# Patient Record
Sex: Female | Born: 1980 | Race: White | Hispanic: No | Marital: Single | Smoking: Current every day smoker
Health system: Southern US, Community
[De-identification: ages and names within clinical notes are randomized; demographics above are authoritative.]

## PROBLEM LIST (undated history)

## (undated) DIAGNOSIS — F419 Anxiety disorder, unspecified: Secondary | ICD-10-CM

## (undated) DIAGNOSIS — F1110 Opioid abuse, uncomplicated: Secondary | ICD-10-CM

## (undated) HISTORY — PX: DILATION AND CURETTAGE OF UTERUS: SHX78

## (undated) HISTORY — PX: WISDOM TOOTH EXTRACTION: SHX21

---

## 2002-05-25 ENCOUNTER — Other Ambulatory Visit: Admission: RE | Admit: 2002-05-25 | Discharge: 2002-05-25 | Payer: Self-pay

## 2009-05-20 ENCOUNTER — Emergency Department (HOSPITAL_COMMUNITY): Admission: EM | Admit: 2009-05-20 | Discharge: 2009-05-20 | Payer: Self-pay | Admitting: Emergency Medicine

## 2009-07-01 ENCOUNTER — Emergency Department (HOSPITAL_COMMUNITY): Admission: EM | Admit: 2009-07-01 | Discharge: 2009-07-02 | Payer: Self-pay | Admitting: Emergency Medicine

## 2009-11-18 ENCOUNTER — Emergency Department (HOSPITAL_COMMUNITY): Admission: EM | Admit: 2009-11-18 | Discharge: 2009-11-19 | Payer: Self-pay | Admitting: Emergency Medicine

## 2010-01-25 ENCOUNTER — Emergency Department (HOSPITAL_COMMUNITY)
Admission: EM | Admit: 2010-01-25 | Discharge: 2010-01-25 | Payer: Self-pay | Source: Home / Self Care | Admitting: Emergency Medicine

## 2010-12-03 ENCOUNTER — Encounter: Payer: Self-pay | Admitting: *Deleted

## 2010-12-03 ENCOUNTER — Emergency Department (HOSPITAL_COMMUNITY)
Admission: EM | Admit: 2010-12-03 | Discharge: 2010-12-04 | Disposition: A | Discharge status: Other Acute Inpatient Hospital | Payer: Medicaid Other | Source: Home / Self Care | Attending: Emergency Medicine | Admitting: Emergency Medicine

## 2010-12-03 DIAGNOSIS — F1120 Opioid dependence, uncomplicated: Secondary | ICD-10-CM

## 2010-12-03 HISTORY — DX: Opioid abuse, uncomplicated: F11.10

## 2010-12-03 LAB — BASIC METABOLIC PANEL
BUN: 8 mg/dL (ref 6–23)
CO2: 27 mEq/L (ref 19–32)
GFR calc non Af Amer: >90 mL/min (ref >90)
Glucose, Bld: 107 mg/dL — ABNORMAL HIGH (ref 70–99)
Potassium: 3.8 mEq/L (ref 3.5–5.1)
Sodium: 139 mEq/L (ref 135–145)

## 2010-12-03 LAB — CBC
Hemoglobin: 12.8 g/dL (ref 12.0–15.0)
MCH: 30.8 pg (ref 26.0–34.0)
MCV: 93.7 fL (ref 78.0–100.0)
Platelets: 276 10*3/uL (ref 150–400)
RBC: 4.15 MIL/uL (ref 3.87–5.11)
WBC: 9.1 10*3/uL (ref 4.0–10.5)

## 2010-12-03 LAB — DIFFERENTIAL
Eosinophils Absolute: 0.1 10*3/uL (ref 0.0–0.7)
Lymphocytes Relative: 39 % (ref 12–46)
Lymphs Abs: 3.5 10*3/uL (ref 0.7–4.0)
Monocytes Relative: 4 % (ref 3–12)
Neutrophils Relative %: 56 % (ref 43–77)

## 2010-12-03 LAB — RAPID URINE DRUG SCREEN, HOSP PERFORMED
Amphetamines: NOT DETECTED
Benzodiazepines: POSITIVE — AB
Cocaine: NOT DETECTED
Opiates: POSITIVE — AB
Tetrahydrocannabinol: POSITIVE — AB

## 2010-12-03 LAB — PREGNANCY, URINE: Preg Test, Ur: NEGATIVE

## 2010-12-03 MED ORDER — LORAZEPAM 1 MG PO TABS
1.0000 mg | ORAL_TABLET | Freq: Once | ORAL | Status: AC
  Administered 2010-12-03: 1 mg via ORAL
  Filled 2010-12-03: qty 1

## 2010-12-03 MED ORDER — NICOTINE 14 MG/24HR TD PT24
14.0000 mg | MEDICATED_PATCH | Freq: Once | TRANSDERMAL | Status: DC
  Administered 2010-12-03: 14 mg via TRANSDERMAL
  Filled 2010-12-03: qty 1

## 2010-12-03 NOTE — ED Provider Notes (Addendum)
History   This chart was scribed for Geoffery Lyons, MD by Clarita Crane. The patient was seen in room APA08/APA08 and the patient's care was started at 7:08PM.   CSN: [id number] Arrival date & time: 12/03/2010  6:38 PM   First MD Initiated Contact with Patient 12/03/10 1844      Chief Complaint  Patient presents with  . Medical Clearance    (Consider location/radiation/quality/duration/timing/severity/associated sxs/prior treatment) HPI Pt arrives in the ED after contacting [facility] and being instructed to come to ED requesting enrollment in detox program for addiction to narcotics including Vicodin and Percocet. Pt states that she has been addicted for 7 years and has tried to stop using on her own but only lasted 3 days without using drugs due to withdrawal symptoms. Pt uses 5-6 pills per day and last used one-half a pill this morning. Pt denies alcohol use. Patient with h/o of anxiety controlled with Xanax.   Past Medical History  Diagnosis Date  . Narcotic abuse     History reviewed. No pertinent past surgical history.  History reviewed. No pertinent family history.  History  Substance Use Topics  . Smoking status: Current Everyday Smoker -- 1.0 packs/day  . Smokeless tobacco: Not on file  . Alcohol Use: No    OB History    Grav Para Term Preterm Abortions TAB SAB Ect Mult Living                  Review of Systems 10 Systems reviewed and are negative for acute change except as noted in the HPI.  Allergies  Review of patient's allergies indicates no known allergies.  Home Medications   Current Outpatient Rx  Name Route Sig Dispense Refill  . ALPRAZOLAM 0.5 MG PO TABS Oral Take 0.5 mg by mouth 3 (three) times daily as needed. For anxiety     . NORETHIN-ETH ESTRAD-FE BIPHAS 1 MG-10 MCG / 10 MCG PO TABS Oral Take 1 tablet by mouth daily.        BP 151/118  Pulse 96  Temp(Src) 97.7 F (36.5 C) (Oral)  Resp 24  Ht 5\' 7"  (1.702 m)  Wt 161 lb (73.029 kg)  BMI  25.22 kg/m2  SpO2 98%  LMP 11/19/2010  Physical Exam  Nursing note and vitals reviewed. Constitutional: She is oriented to person, place, and time. She appears well-developed and well-nourished. No distress.  HENT:  Head: Normocephalic and atraumatic.  Eyes: EOM are normal. Pupils are equal, round, and reactive to light.  Neck: Neck supple. No tracheal deviation present.  Cardiovascular: Normal rate and regular rhythm.   No murmur heard. Pulmonary/Chest: Effort normal and breath sounds normal. No respiratory distress. She has no wheezes.  Abdominal: She exhibits no distension.  Musculoskeletal: Normal range of motion. She exhibits no edema.  Neurological: She is alert and oriented to person, place, and time. No sensory deficit.  Skin: Skin is warm and dry.  Psychiatric: Her speech is normal and behavior is normal. She expresses no homicidal and no suicidal ideation.       Tearful    ED Course  Procedures (including critical care time)  DIAGNOSTIC STUDIES: Oxygen Saturation is 98% on room air, normal by my interpretation.    COORDINATION OF CARE:     Labs Reviewed  BASIC METABOLIC PANEL - Abnormal; Notable for the following:    Glucose, Bld 107 (*)    All other components within normal limits  URINE RAPID DRUG SCREEN (HOSP PERFORMED) - Abnormal; Notable for  the following:    Opiates POSITIVE (*)    Benzodiazepines POSITIVE (*)    Tetrahydrocannabinol POSITIVE (*)    All other components within normal limits  CBC  DIFFERENTIAL  ETHANOL  PREGNANCY, URINE   No results found.   No diagnosis found.    MDM  Patient is requesting detox for narcotic addiction.  Will have act team see her and see what arrangements are available.  At the time of this documentation, this is in process.  Will sign over care to Dr. Lynelle Doctor.       I personally performed the services described in this documentation, which was scribed in my presence. The recorded information has been reviewed  and considered.      Geoffery Lyons, MD 12/03/10 1610  Geoffery Lyons, MD 12/03/10 2258

## 2010-12-03 NOTE — ED Notes (Signed)
Pt wants help to detox from Percocet and Vicodin. Used last at noon today. Pt called [facility] and they told her to come to the ED.

## 2010-12-03 NOTE — BH Assessment (Signed)
Assessment Note   Jaime Vaughn is an 30 y.o. female. PT IS SEEKING OPIATE DETOX. PT REPORTS TAPERING HER DRUG USE OVER A PERIOD OF 2 MONTHS AND ACTUALLY QUIT FOR 3 DAYS ABOUT 1 MONTH AGO. HER WITHDRAWALS WERE SO SEVERE THAT AFTER THE 3RD DAY SHE STARTED ALL OVER AGAIN AND IS AGAIN UP TO 5 PILLS DAILY. PT REPORTS WHEN SHE TRIES TO STOP SHE HURTS PHYSICALLY THAT SHE IS UNABLE TO PROPERLY CARE FOR HER 2 CHILDREN AS SHE SHOULD. PT START USING OPIATES 5 YRS AGO AND IS CURRENTLY  USING 5 PILLS DAILY, LAST USE WAS 1 PILL TODAY (PERCOCET) . SHE HAS BEGUN TO HAVE WITHDRAWALS SUCH A NAUSEA, BODY ACHES COLD AND HOT FLASHES, SENSITIVE TO LIGHT AND LOUD NOISE AND FEELS AGITATED . SHE DENIES A SEIZURE HX.  Axis I: Substance Abuse Axis II: Deferred Axis III:  Past Medical History  Diagnosis Date  . Narcotic abuse    Axis IV: problems related to social environment Axis V: 61-70 mild symptoms  Past Medical History:  Past Medical History  Diagnosis Date  . Narcotic abuse     History reviewed. No pertinent past surgical history.  Family History: History reviewed. No pertinent family history.  Social History:  reports that she has been smoking.  She does not have any smokeless tobacco history on file. She reports that she uses illicit drugs (Marijuana). She reports that she does not drink alcohol.  Allergies: No Known Allergies  Home Medications:  Medications Prior to Admission  Medication Dose Route Frequency Provider Last Rate Last Dose  . LORazepam (ATIVAN) tablet 1 mg  1 mg Oral Once Geoffery Lyons, MD   1 mg at 12/03/10 2017  . nicotine (NICODERM CQ - dosed in mg/24 hours) patch 14 mg  14 mg Transdermal Once Geoffery Lyons, MD       No current outpatient prescriptions on file as of 12/03/2010.    OB/GYN Status:  Patient's last menstrual period was 11/19/2010.  General Assessment Data Assessment Number: 1  Living Arrangements: Alone (with 2 children ages 48 & 2) Can pt return to current living  arrangement?: Yes Admission Status: Voluntary Is patient capable of signing voluntary admission?: Yes Transfer from: Acute Hospital [facility] [phone]) Referral Source: Self/Family/Friend  Risk to self Suicidal Ideation: No Suicidal Intent: No Is patient at risk for suicide?: No Suicidal Plan?: No Access to Means: No What has been your use of drugs/alcohol within the last 12 months?: OPIATES, MARIJUANA Other Self Harm Risks: NA Triggers for Past Attempts: None known Intentional Self Injurious Behavior: None Factors that decrease suicide risk: Children in the home/pregnancy Family Suicide History: No Recent stressful life event(s): Other (Comment) (FAILURE OF QUITTING  DRUG USE ON HER OWN) Persecutory voices/beliefs?: No Depression: No Depression Symptoms:  (NA) Substance abuse history and/or treatment for substance abuse?: Yes Suicide prevention information given to non-admitted patients: Not applicable  Risk to Others Homicidal Ideation: No Thoughts of Harm to Others: No Current Homicidal Intent: No Current Homicidal Plan: No Access to Homicidal Means: No Identified Victim: NA History of harm to others?: No Assessment of Violence: None Noted Violent Behavior Description: NA Does patient have access to weapons?: No Criminal Charges Pending?: No Does patient have a court date: No  Mental Status Report Appear/Hygiene: Improved Eye Contact: Good Motor Activity: Freedom of movement;Tremors;Restlessness;Unsteady Speech: Soft;Logical/coherent Level of Consciousness: Alert;Restless;Crying;Irritable Mood: Anxious;Despair;Fearful;Helpless;Irritable;Sad Affect: Anxious;Sad;Appropriate to circumstance Anxiety Level: Minimal Thought Processes: Coherent;Relevant Judgement: Impaired Orientation: Person;Place;Time;Situation Obsessive Compulsive Thoughts/Behaviors: None  Cognitive Functioning Concentration: Normal Memory: Recent Intact;Remote Intact IQ:  Average Insight: Good Impulse Control: Good Appetite: Poor Sleep: Decreased Total Hours of Sleep: 3  Vegetative Symptoms: None               Values / Beliefs Cultural Requests During Hospitalization: None Spiritual Requests During Hospitalization: None        Additional Information 1:1 In Past 12 Months?: No CIRT Risk: No Elopement Risk: No Does patient have medical clearance?: Yes     Disposition:  Disposition Disposition of Patient: Inpatient treatment program Type of inpatient treatment program: Adult  On Site Evaluation by:   Reviewed with Physician:     [name] 12/03/2010 10:44 PM

## 2010-12-03 NOTE — ED Notes (Signed)
Pt wants to try to quit taking Vicodin and Percocet; has tried before but was unable to do so. Pt anxious. MD aware

## 2010-12-04 ENCOUNTER — Encounter (HOSPITAL_COMMUNITY): Payer: Self-pay | Admitting: *Deleted

## 2010-12-04 ENCOUNTER — Inpatient Hospital Stay (HOSPITAL_COMMUNITY)
Admission: RE | Admit: 2010-12-04 | Discharge: 2010-12-05 | DRG: 897 | Disposition: A | Discharge status: Home / Self Care | Payer: Medicaid Other | Source: Ambulatory Visit | Attending: Psychiatry | Admitting: Psychiatry

## 2010-12-04 DIAGNOSIS — F1120 Opioid dependence, uncomplicated: Principal | ICD-10-CM

## 2010-12-04 MED ORDER — METHOCARBAMOL 500 MG PO TABS
500.0000 mg | ORAL_TABLET | Freq: Three times a day (TID) | ORAL | Status: DC | PRN
  Administered 2010-12-04: 500 mg via ORAL
  Filled 2010-12-04: qty 1

## 2010-12-04 MED ORDER — DICYCLOMINE HCL 20 MG PO TABS: 20.0000 mg | ORAL_TABLET | ORAL | Status: DC | PRN

## 2010-12-04 MED ORDER — LOPERAMIDE HCL 2 MG PO CAPS: 2.0000 mg | ORAL_CAPSULE | ORAL | Status: DC | PRN

## 2010-12-04 MED ORDER — ONDANSETRON 4 MG PO TBDP: 4.0000 mg | ORAL_TABLET | Freq: Four times a day (QID) | ORAL | Status: DC | PRN

## 2010-12-04 MED ORDER — CLONIDINE HCL 0.1 MG PO TABS
0.1000 mg | ORAL_TABLET | ORAL | Status: DC
  Filled 2010-12-04 (×2): qty 1

## 2010-12-04 MED ORDER — LORAZEPAM 1 MG PO TABS: 1.0000 mg | ORAL_TABLET | Freq: Three times a day (TID) | ORAL | Status: DC | PRN

## 2010-12-04 MED ORDER — ACETAMINOPHEN 325 MG PO TABS
650.0000 mg | ORAL_TABLET | ORAL | Status: DC | PRN
  Administered 2010-12-04: 650 mg via ORAL
  Filled 2010-12-04: qty 2

## 2010-12-04 MED ORDER — HYDROXYZINE HCL 25 MG PO TABS
25.0000 mg | ORAL_TABLET | Freq: Four times a day (QID) | ORAL | Status: DC | PRN
  Administered 2010-12-04 – 2010-12-05 (×4): 25 mg via ORAL
  Filled 2010-12-04: qty 1

## 2010-12-04 MED ORDER — MAGNESIUM HYDROXIDE 400 MG/5ML PO SUSP
30.0000 mL | Freq: Every day | ORAL | Status: DC | PRN
  Administered 2010-12-04: 30 mL via ORAL

## 2010-12-04 MED ORDER — QUETIAPINE FUMARATE 50 MG PO TABS
50.0000 mg | ORAL_TABLET | Freq: Every evening | ORAL | Status: DC | PRN
  Administered 2010-12-04: 50 mg via ORAL
  Filled 2010-12-04: qty 1

## 2010-12-04 MED ORDER — CHLORDIAZEPOXIDE HCL 25 MG PO CAPS
25.0000 mg | ORAL_CAPSULE | Freq: Three times a day (TID) | ORAL | Status: DC
  Administered 2010-12-04 – 2010-12-05 (×4): 25 mg via ORAL
  Filled 2010-12-04 (×4): qty 1

## 2010-12-04 MED ORDER — HYDROXYZINE HCL 25 MG PO TABS: 25.0000 mg | ORAL_TABLET | Freq: Four times a day (QID) | ORAL | Status: DC | PRN

## 2010-12-04 MED ORDER — CLONIDINE HCL 0.1 MG PO TABS: 0.1000 mg | ORAL_TABLET | Freq: Every day | ORAL | Status: DC

## 2010-12-04 MED ORDER — ACETAMINOPHEN 325 MG PO TABS
650.0000 mg | ORAL_TABLET | Freq: Four times a day (QID) | ORAL | Status: DC | PRN
  Administered 2010-12-05: 650 mg via ORAL

## 2010-12-04 MED ORDER — NAPROXEN 500 MG PO TABS
500.0000 mg | ORAL_TABLET | Freq: Two times a day (BID) | ORAL | Status: DC | PRN
  Administered 2010-12-04 – 2010-12-05 (×3): 500 mg via ORAL
  Filled 2010-12-04 (×3): qty 1

## 2010-12-04 MED ORDER — NICOTINE 21 MG/24HR TD PT24
21.0000 mg | MEDICATED_PATCH | Freq: Every day | TRANSDERMAL | Status: DC
  Administered 2010-12-04 – 2010-12-05 (×2): 21 mg via TRANSDERMAL
  Filled 2010-12-04 (×3): qty 1

## 2010-12-04 MED ORDER — ALUM & MAG HYDROXIDE-SIMETH 200-200-20 MG/5ML PO SUSP: 30.0000 mL | ORAL | Status: DC | PRN

## 2010-12-04 MED ORDER — NICOTINE 21 MG/24HR TD PT24: 21.0000 mg | MEDICATED_PATCH | Freq: Every day | TRANSDERMAL | Status: DC

## 2010-12-04 MED ORDER — METHOCARBAMOL 500 MG PO TABS: 500.0000 mg | ORAL_TABLET | Freq: Three times a day (TID) | ORAL | Status: DC | PRN

## 2010-12-04 MED ORDER — IBUPROFEN 400 MG PO TABS: 600.0000 mg | ORAL_TABLET | Freq: Three times a day (TID) | ORAL | Status: DC | PRN

## 2010-12-04 MED ORDER — NAPROXEN 500 MG PO TABS: 500.0000 mg | ORAL_TABLET | Freq: Two times a day (BID) | ORAL | Status: DC | PRN

## 2010-12-04 MED ORDER — CLONIDINE HCL 0.1 MG PO TABS
0.1000 mg | ORAL_TABLET | Freq: Four times a day (QID) | ORAL | Status: DC
  Administered 2010-12-04 – 2010-12-05 (×3): 0.1 mg via ORAL
  Filled 2010-12-04 (×8): qty 1

## 2010-12-04 NOTE — ED Provider Notes (Signed)
The patient has been accepted at [facility]. A Bed is  available and she is to be transferred there. 1:29 AM   Celene Kras, MD 12/04/10 [phone]

## 2010-12-04 NOTE — BH Assessment (Addendum)
Psychiatric Admission Assessment Adult  Patient Identification:  Jaime Vaughn Date of Evaluation:  12/04/2010  History of Present Illness:: Pt is a 30 yo female who presented to the ED for opioid withdrawal complications.  She is reporting continued pain, N/V, and diarrhea yesterday.  She started using at the age of 68 and last used yesterday.  She also uses marijuana 2-3x a week.  She denied any other illicit drug use or alcohol consumption at this time.  No IVDA.  She reports continued grief after the loss of a child a few years back and she also had an abortion 23m ago.  She smokes 1ppd of tobacco and would also like to stop smoking at this time.  She has been prescribed Xanax for the past 1.5 years by her OB/GYNE physician.   Past Psychiatric History: Opioid Dependence & Anxiety Disorder NOS  Past Medical History:     Past Medical History  Diagnosis Date  . Narcotic abuse        Past Surgical History  Procedure Date  . Dilation and curettage of uterus     1.47mo ago  . Wisdom tooth extraction     6-67mo ago    Allergies: No Known Allergies  Current Medications:  Prior to Admission medications   Medication Sig Start Date End Date Taking? Authorizing Provider  ALPRAZolam Prudy Feeler) 0.5 MG tablet Take 0.5 mg by mouth 3 (three) times daily as needed. For anxiety    Yes Historical Provider, MD  Norethindrone-Ethinyl Estradiol-Fe Biphas (LO LOESTRIN FE) 1 MG-10 MCG / 10 MCG tablet Take 1 tablet by mouth daily.     Yes Historical Provider, MD    Social History:    reports that she has been smoking Cigarettes.  She has a 10 pack-year smoking history. She has never used smokeless tobacco. She reports that she uses illicit drugs (Marijuana and Opium). She reports that she does not drink alcohol.   Family History:   Denied family history of alcohol or drug use.  No family history of suicide.  Mental Status Examination/Evaluation: Objective:  Appearance: Fairly Groomed  Psychomotor  Activity:  Normal  Eye Contact::  Good  Speech:  Clear and Coherent  Volume:  Normal  Mood:  "ok"  Affect:  Congruent  Thought Process:  Clear and goal directed.  No AVH.    Orientation:  Full  Thought Content:  Neg for any AVH/paranoia/delusions.  Suicidal Thoughts:  No  Homicidal Thoughts:  No  Judgement:  Intact  Insight:  Fair    DIAGNOSIS:    AXIS I Opioid Dependence & Withdrawal; R/o Anxiety D/O NOS, Benzos Dependence.  AXIS II Deferred  AXIS III See medical history.  AXIS IV other psychosocial or environmental problems  AXIS V 51-60 moderate symptoms     Treatment Plan Summary:  1. Continue clonidine detox protocol. 2. Encourage NA participation s/p discharge and therapy for grief related to the loss of her child. 3. Discussed alternatives for anxiety management, will process more regarding use of potential SSRI over benzos. 4. Medication education provided. 5. Continue q15 minute checks for safety.  Nevertheless, the pt is at a low risk of harm to herself or others at this time.  No acute safety issues.    Eulogio Ditch MD

## 2010-12-04 NOTE — Progress Notes (Signed)
Found pt in bed, awake but drowsy before AM group.  Asked pt to come to group.  She agreed.  Did not come to group.

## 2010-12-04 NOTE — Progress Notes (Signed)
Patient ID: Jaime Vaughn, female   DOB: 1980-12-29, 29 y.o.   MRN: [id number]  Patient was upset at the beginning of this shift over a situation that happened with another female patient on the unit. The other patient was propositioning Krystall for her to show him her breasts in exchange that he will give her pain medications. Shakeyla wrote a statement about the events that occurred throughout the day, which can be located within the paper chart. Patient was reassured safety on the unit with 15-minute checks, staff available, and cameras in the hallway and dayrooms. Patient was still upset over the situation and is requesting to be discharged. Expressed to patient intent of speaking to the NP or MD tomorrow morning and patient agreed. After incident, patient has been resting quietly in bed. PRN medication was brought to patient's room. Patient did not receive HS dose of clonidine because blood pressure (systolic) was below 90. Patient denied any symptoms of withdrawal this evening. And was accepting of not receiving HS clonidine dose.

## 2010-12-04 NOTE — Tx Team (Signed)
Initial Interdisciplinary Treatment Plan  PATIENT STRENGTHS: (choose at least two) Ability for insight Average or above average intelligence Communication skills Financial means General fund of knowledge Motivation for treatment/growth Physical Health Special hobby/interest Supportive family/friends  PATIENT STRESSORS: Substance abuse   PROBLEM LIST: Problem List/Patient Goals Date to be addressed Date deferred Reason deferred Estimated date of resolution  Substance Abuse - opiates 11/20                       Goal - stay clean; finish school and get CNA                               DISCHARGE CRITERIA:  Ability to meet basic life and health needs Improved stabilization in mood, thinking, and/or behavior Motivation to continue treatment in a less acute level of care Verbal commitment to aftercare and medication compliance Withdrawal symptoms are absent or subacute and managed without 24-hour nursing intervention  PRELIMINARY DISCHARGE PLAN: Attend aftercare/continuing care group Attend 12-step recovery group Outpatient therapy Return to previous living arrangement  PATIENT/FAMIILY INVOLVEMENT: This treatment plan has been presented to and reviewed with the patient, Jaime Vaughn.  The patient and family have been given the opportunity to ask questions and make suggestions.  Leana Roe Celerina 12/04/2010, 4:24 AM

## 2010-12-04 NOTE — Progress Notes (Signed)
Interdisciplinary Treatment Plan Update (Adult)  Date: 12/04/2010  Time Reviewed: 3:13 PM   Progress in Treatment: Attending groups: No  In bed complaining of pain/withdrawal symptoms Participating in groups:No Taking medication as prescribed: Yes Tolerating medication: Yes   Family/Significant othe contact made:  No  Will contact re: SI info if relevant Patient understands diagnosis:  Unknown Discussing patient identified problems/goals with staff:  Yes  C/O withdrawal symptoms Medical problems stabilized or resolved:  Yes Denies suicidal/homicidal ideation: Yes Issues/concerns per patient self-inventory:  No  Not turned in Other:  New problem(s) identified: N/A  Reason for Continuation of Hospitalization: Depression Medication stabilization Withdrawal symptoms  Interventions implemented related to continuation of hospitalization: Clonodine detox protocol  Observe for withdrawal symptoms from benzos as she came in positive for those as well   Support, encouragement  Additional comments:  Estimated length of stay: 3-4 days  Discharge Plan: unknown  New goal(s): N/A  Review of initial/current patient goals per problem list:   1.  Goal(s):Safely detox from opiates  Met:  No  Target date:11/23  As evidenced ZO:XWRU score decreasing from current to 0  2.  Goal (s):Annelle will identify comprehensive sobriety plan  Met:  No  Target date:11/23  As evidenced by:Identification of such by pt.  3.  Goal(s):  Met:  No  Target date:  As evidenced by:  4.  Goal(s):  Met:  No  Target date:  As evidenced by:  Attendees: Patient:     Family:     Physician:  Lynann Bologna 12/04/2010 3:13 PM   Nursing:   Roswell Miners 12/04/2010 3:13 PM   Case Manager:  Richelle Ito, LCSW 12/04/2010 3:13 PM   Counselor:  Ronda Fairly, LCSWA 12/04/2010 3:13 PM   Other:   12/04/2010 3:13 PM   Other:     Other:     Other:      Scribe for Treatment Team:   Ida Rogue,  12/04/2010 3:13 PM

## 2010-12-04 NOTE — Progress Notes (Addendum)
Patient ID: Jaime Vaughn, female   DOB: 1980/03/21, 30 y.o.   MRN: [id number]  Voluntary. Patient admitted to [facility], states first time ever inpatient. Patient here requesting detox from opiates. States using both percocet and vicodin. First use at age [age]. Buys opiates off the street. Last use was noon on 11/19 (0.5 percocet tab) and AM of 11/19 (1/2 vicodin tab). Reports using between 2-5 pills per day and uses daily. Reports starting because of being 'around the wrong people' and then having wisdom teeth removed and being prescribed pain medication for 2 dry sockets r/t dental surgery. Patient reports occasional use of marijuana. Last use was PM of 11/18. UDS positive for opiates, benzos and THC. Patient is prescribed xanax. 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 PMH unsignificant. Past surgeries are wisdom teeth removal and D&C. Has two daughters, ages 53 and 31. Patient smokes 1 ppd. Patient's belongings secured in locker. Patient given chance to write down phone numbers out of cell phone but patient declined (already had two numbers written on a piece of paper). Escorted and oriented to the 300 hall, offered food and fluids. Skin assessment showed 3 tattoos (back of neck, lower back and right foot), blood draw right AC.  Emergency contact: Talynn Lebon (mother) [phone]

## 2010-12-04 NOTE — Progress Notes (Signed)
Patient ID: Jaime Vaughn, female   DOB: 11-Sep-1980, 30 y.o.   MRN: [id number] She has been up and about and to groups today, interacting with peers and staff. Self inventory : depression 0 hopeless  0 No SI . She has c/o anxiety x 2 today c/o constipation and of pain prn medications given.. At this time the father called and said that she called him and said that a black man  On the unit was offering her his medication if she would  Raise her shirt up.   Suspected person was spoken to and pt was reassured that staff would monitor her closely,

## 2010-12-04 NOTE — ED Notes (Signed)
Report called to [facility] and [facility] called for transport; family kept informed of plan of care. Pt stable

## 2010-12-04 NOTE — H&P (Signed)
Physical Exam  Nursing note and vitals reviewed.  Constitutional: She is oriented to person, place, and time. She appears well-developed and well-nourished. No distress.  HENT:  Head: Normocephalic and atraumatic.  Eyes: EOM are normal. Pupils are equal, round, and reactive to light.  Neck: Neck supple. No tracheal deviation present.  Cardiovascular: Normal rate and regular rhythm.  No murmur heard.  Pulmonary/Chest: Effort normal and breath sounds normal. No respiratory distress. She has no wheezes.  Abdominal: She exhibits no distension.  Musculoskeletal: Normal range of motion. She exhibits no edema.  Neurological: She is alert and oriented to person, place, and time. No sensory deficit.  Skin: Skin is warm and dry.    ROS: WNL  I AGREE WITH PHYSICAL DONE IN ER.

## 2010-12-04 NOTE — Progress Notes (Signed)
Suicide Risk Assessment  Admission Assessment     Demographic factors:  Assessment Details Time of Assessment: Admission Information Obtained From: Patient Current Mental Status:  Current Mental Status:  (denies) Loss Factors:  Loss Factors: Loss of significant relationship (broke up with BF 2 weeks ago) Historical Factors:    Risk Reduction Factors:  Risk Reduction Factors: Responsible for children under 29 years of age;Sense of responsibility to family;Living with another person, especially a relative;Positive social support;Positive therapeutic relationship;Positive coping skills or problem solving skills  CLINICAL FACTORS:   Alcohol/Substance Abuse/Dependencies  COGNITIVE FEATURES THAT CONTRIBUTE TO RISK:  none  SUICIDE RISK:   Minimal: No identifiable suicidal ideation.  Patients presenting with no risk factors but with morbid ruminations; may be classified as minimal risk based on the severity of the depressive symptoms  PLAN OF CARE: see assessment   Jaime Vaughn S 12/04/2010, 12:33 PM

## 2010-12-04 NOTE — Progress Notes (Signed)
[facility] Group Notes:  (Counselor/Nursing/MHT/Case Management/Adjunct)  12/04/2010 3:05 PM  Type of Therapy:  Group Processing at 11:00am  Participation Level:  Active  Participation Quality:  Appropriate, Attentive and Sharing  Affect:  Depressed  Cognitive:  Oriented  Insight:  Limited  Engagement in Group:  Good  Engagement in Therapy:  Limited  Modes of Intervention:  Clarification, Orientation, Socialization and Support  Summary of Progress/Problems: Patient shared that "opiates have been the major focus of my daily life for so long I can only see my babies tears, I cannot even hear my babies anymore."  "Patient shared that she went for three days alone without opiates yet was unable to go any longer and this was on month ago" Pt also share how she is afrain of how I will end up if I continue to use like I am doing now. "It is the first thing I think of each morning, it is that important"   Jaime Vaughn 12/04/2010, 3:05 PM

## 2010-12-05 NOTE — Progress Notes (Signed)
Pt seen in AM group.  Tearful, requesting d/c today.  Reacting to incident with another pt last nite.  Outlines sobriety plan as staying close to her family and going to IOP at [facility] in [place].  Possible d/c today.

## 2010-12-05 NOTE — Progress Notes (Signed)
Writer spoke with patient after reading notes as to incident between Pearl and another patient which has her very anxious to leave. Pt reports she was promised to see Dr first thing this morning and was disturbed that she had not been seen, although others were. Writer, patient and physician sat down with patient about 9:45 to discuss events and possible discharge.Writer agreed to call pt's father with pt on speaker phone. Pt's mother was reached by phone, Clinical research associate gave both mother and pt assurance that family will be contacted as soon as discharge orders are in place. Writer also expressed regret to both pt and mother that the unfortunate incident occurred and that patient has experienced unease on the unit. Mother stated patient was so afraid that the parents had to call and report vs the pt reporting. Mother states that she believes pt will be more comfortable at home and that they will support her as she follows up at [facility]. Mother inquired as to any action taken against the other patient and was told that patient had been discharged.
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
## 2010-12-05 NOTE — Progress Notes (Signed)
Patient ID: Jaime Vaughn, female   DOB: [birth year], 30 y.o.   MRN: [id number] Nursing Discharge  Pt is happy to be discharged to Dad.She denies S/I,H/I and A/v hallucinations.All belongings ,instructions and Suicide Prevention information given to patient.Escorted to lobby.

## 2010-12-05 NOTE — Progress Notes (Signed)
Patient ID: Jaime Vaughn, female   DOB: 1980/02/08, 30 y.o.   MRN: [id number] Nursing April is Frightened to stay here and  Would like to be able to go home. She rates her hopelessness and depression as 1/10. She denies  S/I,H/I and A/v hallucinations.Her Goal today is to stay straight.Encouraged and supported.

## 2010-12-05 NOTE — Progress Notes (Signed)
[facility] Group Notes:  (Counselor/Nursing/MHT/Case Management/Adjunct)  12/05/2010 12:19 PM  Type of Therapy:  Group session 1:15 on Tues 11/20  Participation Level:  Minimal  Participation Quality:  Appropriate, Attentive and Sharing  Affect:  Appropriate  Cognitive:  Appropriate  Insight:  Limited  Engagement in Group:  Limited  Engagement in Therapy:  Limited  Modes of Intervention:  Exploration, clarification and support  Summary of Progress/Problems: April attended group session about triggers to relapse and developing alternative behaviors. She mentioned that she had never attended AA/NA meetings but did express interest in doing so. Seemed open to group members suggestion of relying on supportive others  Clide Dales 12/05/2010, 12:26 PM
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
## 2010-12-05 NOTE — Progress Notes (Signed)
[facility] Group Notes:  (Counselor/Nursing/MHT/Case Management/Adjunct)  12/05/2010 12:44 PM  Type of Therapy:  Process group at 11:00AM   Clide Dales 12/05/2010, 12:44 PM

## 2010-12-07 NOTE — Progress Notes (Signed)
Patient Discharge Instructions:  Dictated admission note faxed, Date faxed:  12/07/2010 D/C instructions faxed, Date faxed:  12/07/2010 D/C Summary faxed, Date faxed:  12/07/2010 Med. Rec. Form faxed, Date faxed:  12/07/2010 Facesheet faxed 12/07/2010  Wandra Scot, 12/07/2010, 2:01 PM

## 2011-01-25 NOTE — Discharge Summary (Signed)
See SAR upon discharge.

## 2011-06-01 ENCOUNTER — Emergency Department (HOSPITAL_COMMUNITY)
Admission: EM | Admit: 2011-06-01 | Discharge: 2011-06-02 | Disposition: A | Discharge status: Home / Self Care | Payer: Medicaid Other | Attending: Emergency Medicine | Admitting: Emergency Medicine

## 2011-06-01 ENCOUNTER — Encounter (HOSPITAL_COMMUNITY): Payer: Self-pay

## 2011-06-01 ENCOUNTER — Emergency Department (HOSPITAL_COMMUNITY): Payer: Medicaid Other

## 2011-06-01 DIAGNOSIS — F17200 Nicotine dependence, unspecified, uncomplicated: Secondary | ICD-10-CM | POA: Insufficient documentation

## 2011-06-01 DIAGNOSIS — M25539 Pain in unspecified wrist: Secondary | ICD-10-CM | POA: Insufficient documentation

## 2011-06-01 DIAGNOSIS — M25439 Effusion, unspecified wrist: Secondary | ICD-10-CM | POA: Insufficient documentation

## 2011-06-01 MED ORDER — OXYCODONE-ACETAMINOPHEN 5-325 MG PO TABS
1.0000 | ORAL_TABLET | Freq: Once | ORAL | Status: AC
  Administered 2011-06-01: 1 via ORAL
  Filled 2011-06-01: qty 1

## 2011-06-01 NOTE — ED Provider Notes (Signed)
History     CSN: [id number]  Arrival date & time 06/01/11  2148   First MD Initiated Contact with Patient 06/01/11 2207      Chief Complaint  Patient presents with  . Wrist Injury    (Consider location/radiation/quality/duration/timing/severity/associated sxs/prior treatment) HPI Comments: Patient c/o pain and swelling of her left wrist since falling on an outstretched hand earlier today.  Reports immediate swelling to her wrist also.  Pain is reproduced with any movement of the wrist or hand.  She denies head injury, LOC, neck pain, shoulder or elbow pain.  Patient is right hand dominant  Patient is a 31 y.o. female presenting with wrist injury. The history is provided by the patient.  Wrist Injury  The incident occurred 6 to 12 hours ago. Incident location: occurred while roller skating. The injury mechanism was a fall. The pain is present in the left wrist. The quality of the pain is described as throbbing. The pain is moderate. The pain has been constant since the incident. Pertinent negatives include no fever and no malaise/fatigue. She reports no foreign bodies present. The symptoms are aggravated by movement, use and palpation. She has tried nothing for the symptoms. The treatment provided no relief.    Past Medical History  Diagnosis Date  . Narcotic abuse     Past Surgical History  Procedure Date  . Dilation and curettage of uterus     1.59mo ago  . Wisdom tooth extraction     6-87mo ago    No family history on file.  History  Substance Use Topics  . Smoking status: Current Everyday Smoker -- 1.0 packs/day for 10 years    Types: Cigarettes  . Smokeless tobacco: Never Used  . Alcohol Use: No    OB History    Grav Para Term Preterm Abortions TAB SAB Ect Mult Living                  Review of Systems  Constitutional: Negative for fever, chills and malaise/fatigue.  Genitourinary: Negative for dysuria and difficulty urinating.  Musculoskeletal: Positive for  joint swelling and arthralgias.  Skin: Negative for color change and wound.  Neurological: Negative for dizziness and headaches.  All other systems reviewed and are negative.    Allergies  Review of patient's allergies indicates no known allergies.  Home Medications   Current Outpatient Rx  Name Route Sig Dispense Refill  . ALPRAZOLAM 0.5 MG PO TABS Oral Take 0.5 mg by mouth 3 (three) times daily as needed. For anxiety     . NORETHIN-ETH ESTRAD-FE BIPHAS 1 MG-10 MCG / 10 MCG PO TABS Oral Take 1 tablet by mouth daily.        BP 106/52  Pulse 77  Temp(Src) 98.5 F (36.9 C) (Oral)  Resp 18  Ht 5\' 7"  (1.702 m)  Wt 145 lb (65.772 kg)  BMI 22.71 kg/m2  SpO2 100%  LMP 05/09/2011  Physical Exam  Nursing note and vitals reviewed. Constitutional: She is oriented to person, place, and time. She appears well-developed and well-nourished. No distress.  HENT:  Head: Normocephalic and atraumatic.  Cardiovascular: Normal rate, regular rhythm and normal heart sounds.   Pulmonary/Chest: Effort normal and breath sounds normal.  Musculoskeletal: She exhibits edema and tenderness.       Left  wrist is ttp at the medial aspect.  Radial pulse is brisk, sensation intact.  CR< 2 sec.  Deformity is present.  ROM is limited due to level of pain.  Neurological: She is alert and oriented to person, place, and time. She exhibits normal muscle tone. Coordination normal.  Skin: Skin is warm and dry.    ED Course  Procedures (including critical care time)    Dg Wrist Complete Left  06/01/2011  *RADIOLOGY REPORT*  Clinical Data: Left wrist pain status post fall.  LEFT WRIST - COMPLETE 3+ VIEW  Comparison: None.  Findings: No fracture or dislocation.  No aggressive osseous lesion.  IMPRESSION: No acute osseous abnormality identified. If clinical concern for a fracture persists, recommend a repeat radiograph in 7-10 days to evaluate for interval change or callus formation.  Original Report Authenticated  By: Waneta Martins, M.D.    Volar wrist splint applied by nursing staff, pain improved.  Remains NV intact.   MDM    Previous medical charts, nursing notes and vitals signs from this visit were reviewed by me   All laboratory results and/or imaging results performed on this visit, if applicable, were reviewed by me and discussed with the patient and/or parent as well as recommendation for follow-up    MEDICATIONS GIVEN IN ED: percocet po  ttp of the distal left wrist with deformity present to the medial aspect.  Radial pulse is brisk, CR<2 sec, sensation intact.  I have discussed x-ray finding with the patient and the possibility for an occult scaphoid fx.  Will apply volar splint and discussed the need for close ortho f/u and possible repeat imaging in 7-10 days.  Pt verbalized understanding     PRESCRIPTIONS GIVEN AT DISCHARGE:  Percocet # 10, ibuprofen    Pt stable in ED with no significant deterioration in condition. Pt feels improved after observation and/or treatment in ED. Patient / Family / Caregiver understand and agree with initial ED impression and plan with expectations set for ED visit.  Patient agrees to return to ED for any worsening symptoms          Idali Lafever L. Ladon Heney, [place] 06/02/11 0007

## 2011-06-01 NOTE — ED Notes (Signed)
Fell while roller skating today, swelling to left wrist

## 2011-06-02 MED ORDER — OXYCODONE-ACETAMINOPHEN 5-325 MG PO TABS: 1.0000 | ORAL_TABLET | ORAL | Status: DC | PRN

## 2011-06-02 MED ORDER — OXYCODONE-ACETAMINOPHEN 5-325 MG PO TABS: 1.0000 | ORAL_TABLET | ORAL | Status: AC | PRN

## 2011-06-02 MED ORDER — IBUPROFEN 800 MG PO TABS: 800.0000 mg | ORAL_TABLET | Freq: Three times a day (TID) | ORAL | Status: AC

## 2011-06-02 NOTE — ED Provider Notes (Signed)
Medical screening examination/treatment/procedure(s) were performed by non-physician practitioner and as supervising physician I was immediately available for consultation/collaboration.   Glynn Octave, MD 06/02/11 [phone]

## 2011-06-02 NOTE — Discharge Instructions (Signed)
Wrist Pain Wrist injuries are frequent in adults and children. A sprain is an injury to the ligaments that hold your bones together. A strain is an injury to muscle or muscle cord-like structures (tendons) from stretching or pulling. Generally, when wrists are moderately tender to touch following a fall or injury, a break in the bone (fracture) may be present. Most wrist sprains or strains are better in 3 to 5 days, but complete healing may take several weeks. HOME CARE INSTRUCTIONS   Put ice on the injured area.   Put ice in a plastic bag.   Place a towel between your skin and the bag.   Leave the ice on for 15 to 20 minutes, 3 to 4 times a day, for the first 2 days.   Keep your arm raised above the level of your heart whenever possible to reduce swelling and pain.   Rest the injured area for at least 48 hours or as directed by your caregiver.   If a splint or elastic bandage has been applied, use it for as long as directed by your caregiver or until seen by a caregiver for a follow-up exam.   Only take over-the-counter or prescription medicines for pain, discomfort, or fever as directed by your caregiver.   Keep all follow-up appointments. You may need to follow up with a specialist or have follow-up X-rays. Improvement in pain level is not a guarantee that you did not fracture a bone in your wrist. The only way to determine whether or not you have a broken bone is by X-ray.  SEEK IMMEDIATE MEDICAL CARE IF:   Your fingers are swollen, very red, white, or cold and blue.   Your fingers are numb or tingling.   You have increasing pain.   You have difficulty moving your fingers.  MAKE SURE YOU:   Understand these instructions.   Will watch your condition.   Will get help right away if you are not doing well or get worse.  Document Released: 10/10/2004 Document Revised: 12/20/2010 Document Reviewed: 02/21/2010 [employer] Patient Information 2012 [employer]. 

## 2011-06-03 ENCOUNTER — Encounter (HOSPITAL_COMMUNITY): Payer: Self-pay | Admitting: *Deleted

## 2011-06-03 ENCOUNTER — Emergency Department (HOSPITAL_COMMUNITY)
Admission: EM | Admit: 2011-06-03 | Discharge: 2011-06-03 | Disposition: A | Discharge status: Home / Self Care | Payer: Medicaid Other | Attending: Emergency Medicine | Admitting: Emergency Medicine

## 2011-06-03 DIAGNOSIS — W19XXXA Unspecified fall, initial encounter: Secondary | ICD-10-CM | POA: Insufficient documentation

## 2011-06-03 DIAGNOSIS — F17200 Nicotine dependence, unspecified, uncomplicated: Secondary | ICD-10-CM | POA: Insufficient documentation

## 2011-06-03 DIAGNOSIS — M25539 Pain in unspecified wrist: Secondary | ICD-10-CM

## 2011-06-03 DIAGNOSIS — F411 Generalized anxiety disorder: Secondary | ICD-10-CM | POA: Insufficient documentation

## 2011-06-03 MED ORDER — OXYCODONE-ACETAMINOPHEN 5-325 MG PO TABS: 1.0000 | ORAL_TABLET | Freq: Four times a day (QID) | ORAL | Status: AC | PRN

## 2011-06-03 MED ORDER — IBUPROFEN 800 MG PO TABS: 800.0000 mg | ORAL_TABLET | Freq: Three times a day (TID) | ORAL | Status: AC

## 2011-06-03 MED FILL — Oxycodone w/ Acetaminophen Tab 5-325 MG: ORAL | Qty: 6 | Status: AC

## 2011-06-03 NOTE — Discharge Instructions (Signed)
Please apply ice to the wrist and forearm. Use your sling at all times. See the orthopedic MD as suggested. Ibuprofen three times daily with food. Percocet for pain if needed. This medication may cause drowsiness and constipation, use with caution.

## 2011-06-03 NOTE — ED Provider Notes (Signed)
History     CSN: [id number]  Arrival date & time 06/03/11  1542   First MD Initiated Contact with Patient 06/03/11 1558      Chief Complaint  Patient presents with  . Arm Injury    (Consider location/radiation/quality/duration/timing/severity/associated sxs/prior treatment) HPI Comments: Pt sustained a fall on an out stretched hand on 5/18. Xray was negative. Pt was fitted with a posterior splint and treated with ibuprofen and percocet. Pt states the pain is worse, she is out of the percocet and the ibuprofen, and she can not see the consultant for another 5 to 7 days. She request reevaluation and pain management.  Patient is a 31 y.o. female presenting with arm injury. The history is provided by the patient.  Arm Injury  Pertinent negatives include no chest pain, no abdominal pain, no neck pain, no seizures and no cough.    Past Medical History  Diagnosis Date  . Narcotic abuse     Past Surgical History  Procedure Date  . Dilation and curettage of uterus     1.7mo ago  . Wisdom tooth extraction     6-62mo ago    History reviewed. No pertinent family history.  History  Substance Use Topics  . Smoking status: Current Everyday Smoker -- 1.0 packs/day for 10 years    Types: Cigarettes  . Smokeless tobacco: Never Used  . Alcohol Use: No    OB History    Grav Para Term Preterm Abortions TAB SAB Ect Mult Living                  Review of Systems  Constitutional: Negative for activity change.       All ROS Neg except as noted in HPI  HENT: Negative for nosebleeds and neck pain.   Eyes: Negative for photophobia and discharge.  Respiratory: Negative for cough, shortness of breath and wheezing.   Cardiovascular: Negative for chest pain and palpitations.  Gastrointestinal: Negative for abdominal pain and blood in stool.  Genitourinary: Negative for dysuria, frequency and hematuria.  Musculoskeletal: Positive for arthralgias. Negative for back pain.  Skin: Negative.     Neurological: Negative for dizziness, seizures and speech difficulty.  Psychiatric/Behavioral: Negative for hallucinations and confusion. The patient is nervous/anxious.     Allergies  Review of patient's allergies indicates no known allergies.  Home Medications   Current Outpatient Rx  Name Route Sig Dispense Refill  . ALPRAZOLAM 0.5 MG PO TABS Oral Take 0.5 mg by mouth 3 (three) times daily as needed. For anxiety     . IBUPROFEN 800 MG PO TABS Oral Take 1 tablet (800 mg total) by mouth 3 (three) times daily. 30 tablet 0  . NORETHIN-ETH ESTRAD-FE BIPHAS 1 MG-10 MCG / 10 MCG PO TABS Oral Take 1 tablet by mouth every evening.     . OXYCODONE-ACETAMINOPHEN 5-325 MG PO TABS Oral Take 1 tablet by mouth every 4 (four) hours as needed for pain. 10 tablet 0    BP 135/61  Pulse 73  Temp(Src) 97.8 F (36.6 C) (Oral)  Resp 17  Ht 5\' 7"  (1.702 m)  Wt 145 lb (65.772 kg)  BMI 22.71 kg/m2  SpO2 100%  LMP 05/09/2011  Physical Exam  Nursing note and vitals reviewed. Constitutional: She is oriented to person, place, and time. She appears well-developed and well-nourished.  Non-toxic appearance.  HENT:  Head: Normocephalic.  Right Ear: Tympanic membrane and external ear normal.  Left Ear: Tympanic membrane and external ear normal.  Eyes:  EOM and lids are normal. Pupils are equal, round, and reactive to light.  Neck: Normal range of motion. Neck supple. Carotid bruit is not present.  Cardiovascular: Normal rate, regular rhythm, normal heart sounds, intact distal pulses and normal pulses.   Pulmonary/Chest: Breath sounds normal. No respiratory distress.  Abdominal: Soft. Bowel sounds are normal. There is no tenderness. There is no guarding.  Musculoskeletal: Normal range of motion.       FROM of the left shoulder and elbow. No effusion. Pain to palpation of the forearm, more pain with palpation of the wrist.. No hot joint. No palpable hematoma. Good cap refill on the left . Fingers not  swollen.  Lymphadenopathy:       Head (right side): No submandibular adenopathy present.       Head (left side): No submandibular adenopathy present.    She has no cervical adenopathy.  Neurological: She is alert and oriented to person, place, and time. She has normal strength. No cranial nerve deficit or sensory deficit.  Skin: Skin is warm and dry.  Psychiatric: Her speech is normal. Her mood appears anxious.    ED Course  Procedures (including critical care time)  Labs Reviewed - No data to display Dg Wrist Complete Left  06/01/2011  *RADIOLOGY REPORT*  Clinical Data: Left wrist pain status post fall.  LEFT WRIST - COMPLETE 3+ VIEW  Comparison: None.  Findings: No fracture or dislocation.  No aggressive osseous lesion.  IMPRESSION: No acute osseous abnormality identified. If clinical concern for a fracture persists, recommend a repeat radiograph in 7-10 days to evaluate for interval change or callus formation.  Original Report Authenticated By: Waneta Martins, M.D.     No diagnosis found.    MDM  I have reviewed nursing notes, vital signs, and all appropriate lab and imaging results for this patient. Pt given new Rx for ibuprofen and percocet.Pt to see the orthopedic MD as scheduled. No new xrays obtained, will reserve new opinion for specialist. Pt encourages to use the sling at all times.       Kathie Dike, [place] 06/03/11 [phone]

## 2011-06-03 NOTE — ED Notes (Signed)
Seen here 5/18 for injury to lt arm Has splint in place.  Could not get appt with ortho.  And is having pain.

## 2011-06-04 NOTE — ED Provider Notes (Signed)
Medical screening examination/treatment/procedure(s) were performed by non-physician practitioner and as supervising physician I was immediately available for consultation/collaboration. Devoria Albe, MD, [facility]   Ward Givens, MD 06/04/11 [phone]

## 2011-06-08 ENCOUNTER — Encounter (HOSPITAL_COMMUNITY): Payer: Self-pay | Admitting: *Deleted

## 2011-06-08 ENCOUNTER — Emergency Department (HOSPITAL_COMMUNITY)
Admission: EM | Admit: 2011-06-08 | Discharge: 2011-06-08 | Disposition: A | Discharge status: Home / Self Care | Payer: Medicaid Other | Attending: Emergency Medicine | Admitting: Emergency Medicine

## 2011-06-08 DIAGNOSIS — R52 Pain, unspecified: Secondary | ICD-10-CM | POA: Insufficient documentation

## 2011-06-08 DIAGNOSIS — F17200 Nicotine dependence, unspecified, uncomplicated: Secondary | ICD-10-CM | POA: Insufficient documentation

## 2011-06-08 DIAGNOSIS — M25539 Pain in unspecified wrist: Secondary | ICD-10-CM | POA: Insufficient documentation

## 2011-06-08 DIAGNOSIS — M25532 Pain in left wrist: Secondary | ICD-10-CM

## 2011-06-08 MED ORDER — OXYCODONE-ACETAMINOPHEN 5-325 MG PO TABS: 1.0000 | ORAL_TABLET | Freq: Once | ORAL | Status: DC

## 2011-06-08 MED ORDER — OXYCODONE-ACETAMINOPHEN 5-325 MG PO TABS
1.0000 | ORAL_TABLET | Freq: Once | ORAL | Status: AC
  Administered 2011-06-08: 1 via ORAL
  Filled 2011-06-08: qty 1

## 2011-06-08 MED ORDER — OXYCODONE-ACETAMINOPHEN 5-325 MG PO TABS: 1.0000 | ORAL_TABLET | Freq: Four times a day (QID) | ORAL | Status: AC | PRN

## 2011-06-08 NOTE — ED Notes (Signed)
J. Idol, PA at bedside. 

## 2011-06-08 NOTE — Discharge Instructions (Signed)
Plan to see the orthopedist on Wednesday as planned.  You diagnosis is wrist pain with possibility of a wrist fracture although your original x-ray does not show any indication of this.  It is possible that you have an occult fracture however.   we cannot prescribe any more narcotic pain medications, any further pain control will need to come from your orthopedist.

## 2011-06-08 NOTE — ED Notes (Signed)
Pt c/o pain in her left wrist. Pt states that she was seen here for a fractured wrist. Pt states that she has an appt on Wednesday with an orthopedic MD. Pt also request that we change her splint because it stinks.

## 2011-06-10 NOTE — ED Provider Notes (Signed)
History     CSN: [id number]  Arrival date & time 06/08/11  1745   First MD Initiated Contact with Patient 06/08/11 1834      Chief Complaint  Patient presents with  . Wrist Pain    (Consider location/radiation/quality/duration/timing/severity/associated sxs/prior treatment) HPI Comments: Jaime Vaughn presents with complaint of persistent pain in her left wrist since she was seen here on May 18 after she fell while rollerskating.  There is concern about possible occult scaphoid fracture and she was placed in a splint and is anticipating or so followup in 5 days as she was referred to [facility] orthopedics for further management.  She has run out of her oxycodone and is asking for refill until she can get in with the orthopedist.  She is also asking for [place] to replace her Ace wrap and the webbing of her splint as it became wet and now has an odor.  She denies any numbness or weakness in her fingers but range of motion of fingers and wrist is painful but unchanged since her original injury.  Patient is a 31 y.o. female presenting with wrist pain. The history is provided by the patient.  Wrist Pain Associated symptoms include arthralgias. Pertinent negatives include no joint swelling, numbness or weakness.    Past Medical History  Diagnosis Date  . Narcotic abuse     Past Surgical History  Procedure Date  . Dilation and curettage of uterus     1.18mo ago  . Wisdom tooth extraction     6-26mo ago    History reviewed. No pertinent family history.  History  Substance Use Topics  . Smoking status: Current Everyday Smoker -- 1.0 packs/day for 10 years    Types: Cigarettes  . Smokeless tobacco: Never Used  . Alcohol Use: No    OB History    Grav Para Term Preterm Abortions TAB SAB Ect Mult Living                  Review of Systems  Musculoskeletal: Positive for arthralgias. Negative for joint swelling.  Skin: Negative for wound.  Neurological: Negative for weakness and  numbness.    Allergies  Review of patient's allergies indicates no known allergies.  Home Medications   Current Outpatient Rx  Name Route Sig Dispense Refill  . ALPRAZOLAM 0.5 MG PO TABS Oral Take 0.5 mg by mouth 3 (three) times daily as needed. For anxiety     . IBUPROFEN 800 MG PO TABS Oral Take 1 tablet (800 mg total) by mouth 3 (three) times daily. 30 tablet 0  . IBUPROFEN 800 MG PO TABS Oral Take 1 tablet (800 mg total) by mouth 3 (three) times daily. 21 tablet 0  . NORETHIN-ETH ESTRAD-FE BIPHAS 1 MG-10 MCG / 10 MCG PO TABS Oral Take 1 tablet by mouth every evening.     . OXYCODONE-ACETAMINOPHEN 5-325 MG PO TABS Oral Take 1 tablet by mouth every 4 (four) hours as needed for pain. 10 tablet 0  . OXYCODONE-ACETAMINOPHEN 5-325 MG PO TABS Oral Take 1 tablet by mouth every 6 (six) hours as needed for pain. 20 tablet 0  . OXYCODONE-ACETAMINOPHEN 5-325 MG PO TABS Oral Take 1 tablet by mouth every 6 (six) hours as needed for pain. 10 tablet 0    BP 111/64  Pulse 88  Temp(Src) 97.8 F (36.6 C) (Oral)  Resp 20  Ht 5\' 7"  (1.702 m)  Wt 145 lb (65.772 kg)  BMI 22.71 kg/m2  SpO2 100%  LMP 05/09/2011  Physical Exam  Constitutional: She appears well-developed and well-nourished.  HENT:  Head: Atraumatic.  Neck: Normal range of motion.  Cardiovascular:       Pulses equal bilaterally  Musculoskeletal: She exhibits tenderness.       Left wrist: She exhibits decreased range of motion and bony tenderness. She exhibits no swelling and no deformity.       Tender to palpation along left first metacarpal bone and she does have snuffbox tenderness.  There is no edema or ecchymosis noted.  Less than 2 second cap refill and distal sensation is intact.  Neurological: She is alert. She has normal strength. She displays normal reflexes. No sensory deficit.       Equal strength  Skin: Skin is warm and dry.  Psychiatric: She has a normal mood and affect.    ED Course  Procedures (including  critical care time)  Labs Reviewed - No data to display No results found.   1. Wrist pain, acute, left     Patient's splint was rewrapped using a fresh ACE and webril.  MDM  Patient is anticipating followup with orthopedics in 5 days, she was given oxycodone #10 but instructed that she cannot receive any further narcotics from the ED for this injury and she will need to make this prescription last until her followup with the orthopedic physician.  Patient understands this plan and she was also encouraged to continue her ibuprofen which she has been taking as well.        Burgess Amor, [place] 06/10/11 [phone]

## 2011-06-13 NOTE — ED Provider Notes (Signed)
Medical screening examination/treatment/procedure(s) were performed by non-physician practitioner and as supervising physician I was immediately available for consultation/collaboration.   Breslin Hemann M Elynor Kallenberger, MD 06/13/11 0049 

## 2012-01-12 IMAGING — US US EXTREM LOW VENOUS*L*
1 series · 14 of 24 positions shown · non-contrast
Comparison: None

CLINICAL DATA: Left lower extremity pain, swelling.

LEFT LOWER EXTREMITY VENOUS DUPLEX ULTRASOUND
TECHNIQUE: Gray-scale sonography with graded compression, as well
as color Doppler and duplex ultrasound were performed to evaluate
the deep venous system of the lower extremity from the level of the
common femoral vein through the popliteal and proximal calf veins.
Spectral Doppler was utilized to evaluate flow at rest and with
distal augmentation maneuvers.

[Series 1: us extrem low venous*left* · 14 of 49 slices shown]
[im 1/49]
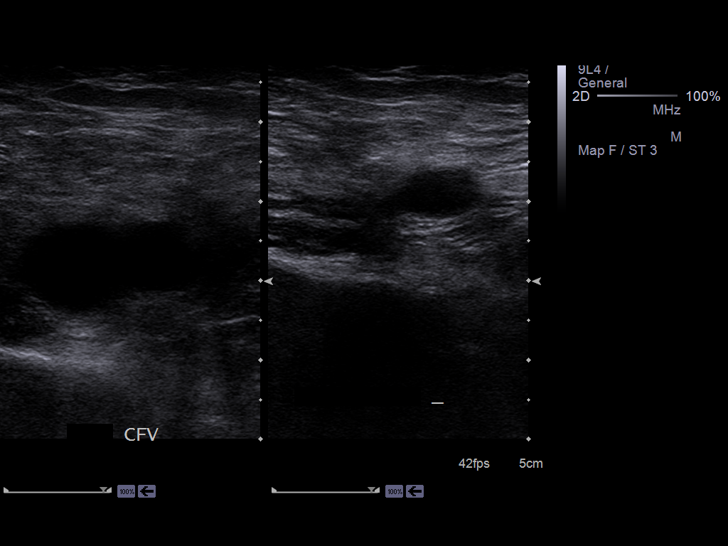
[im 5/49]
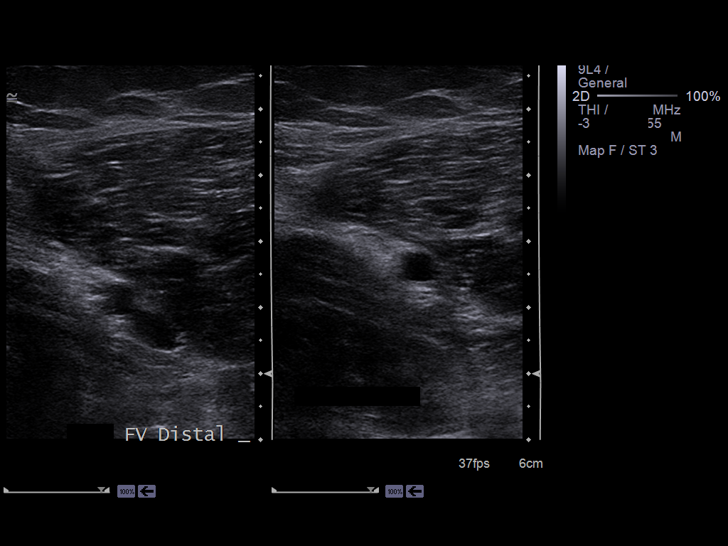
[im 9/49]
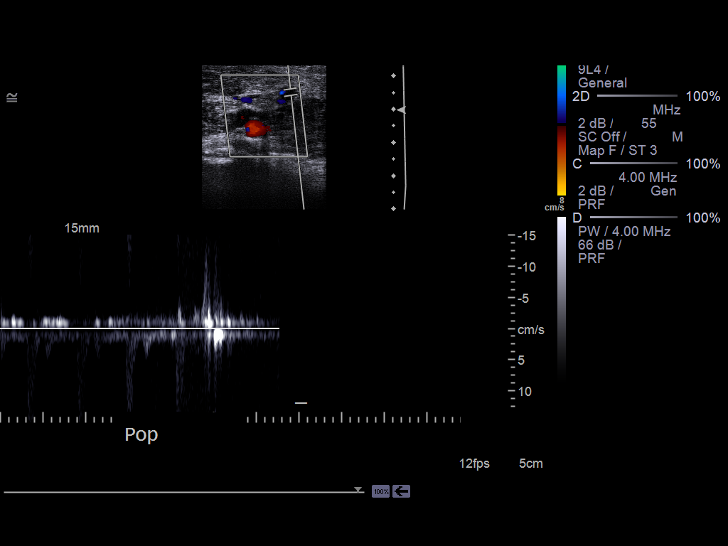
[im 13/49]
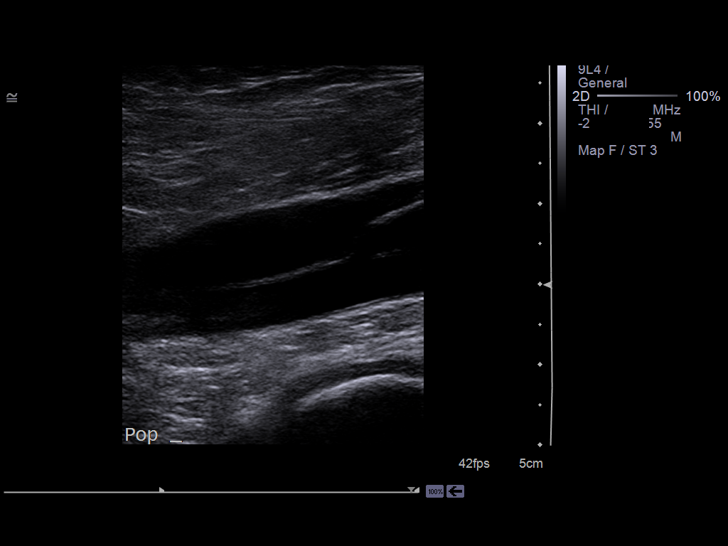
[im 15/49]
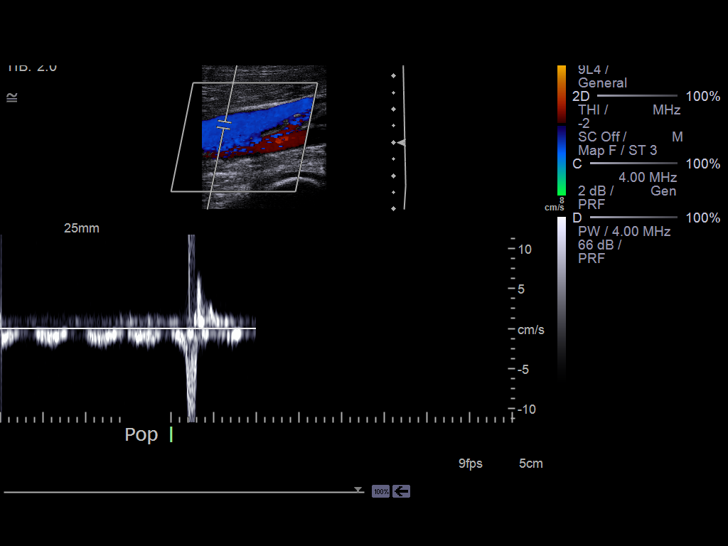
[im 19/49]
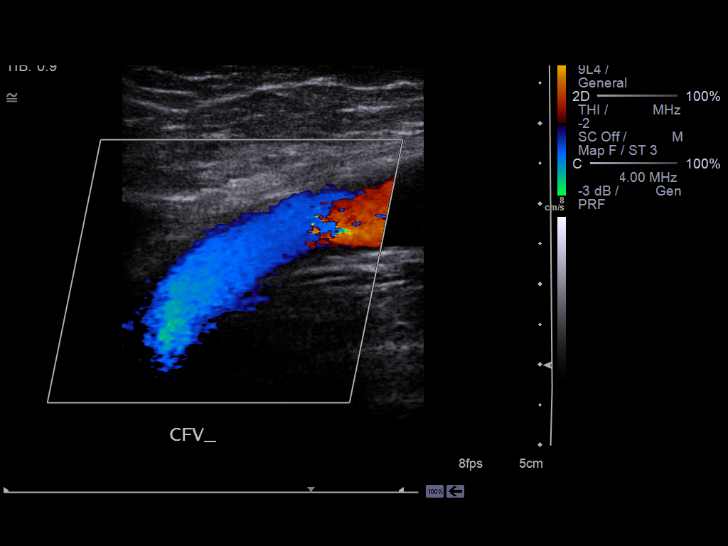
[im 23/49]
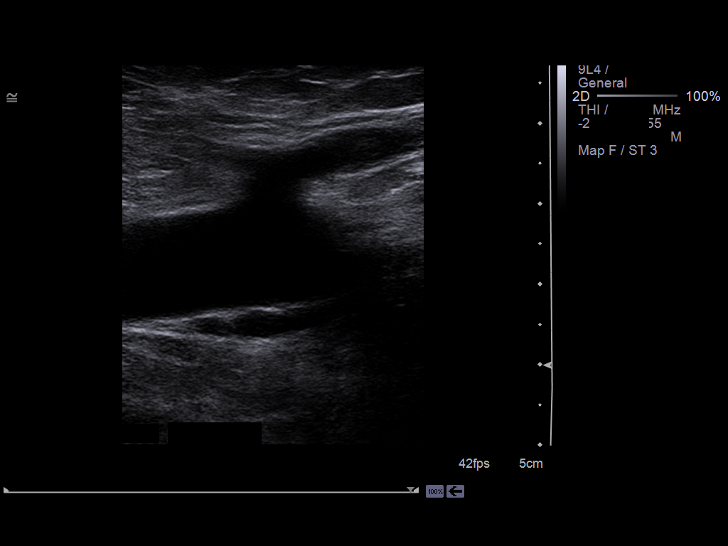
[im 26/49]
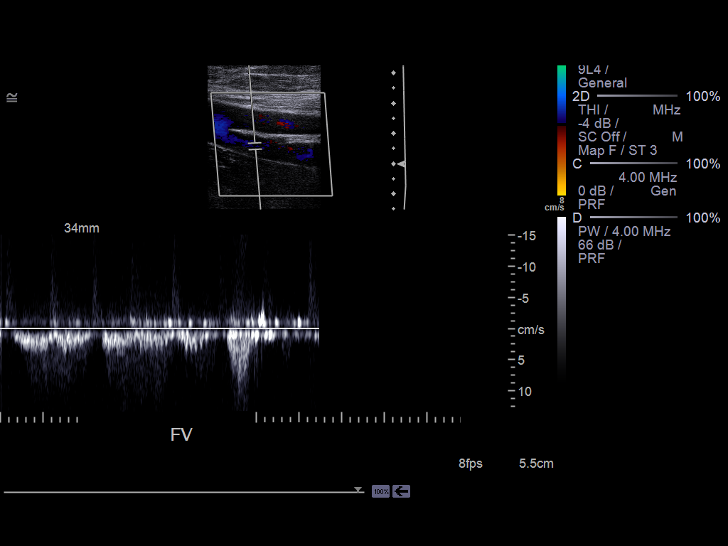
[im 30/49]
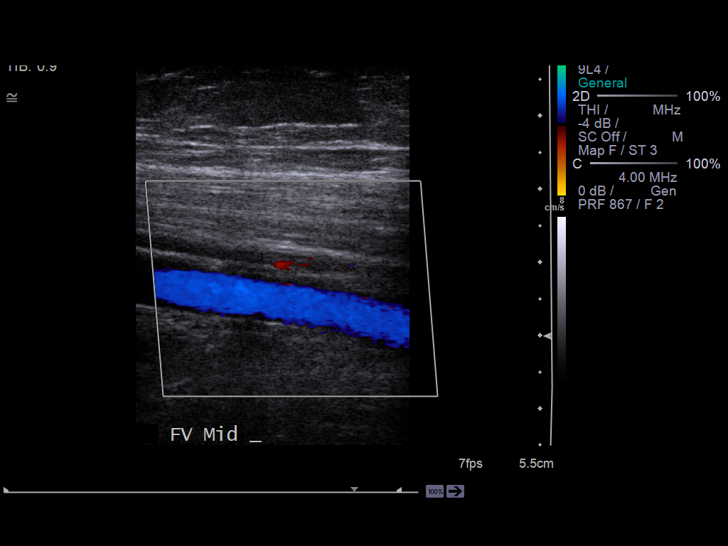
[im 34/49]
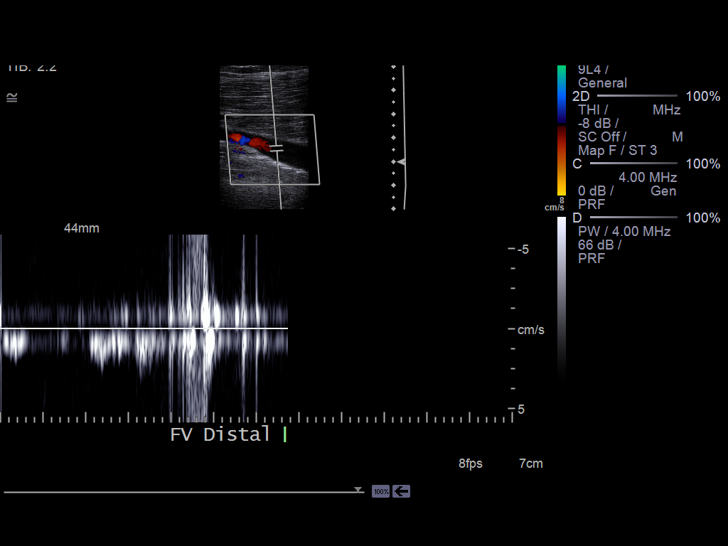
[im 38/49]
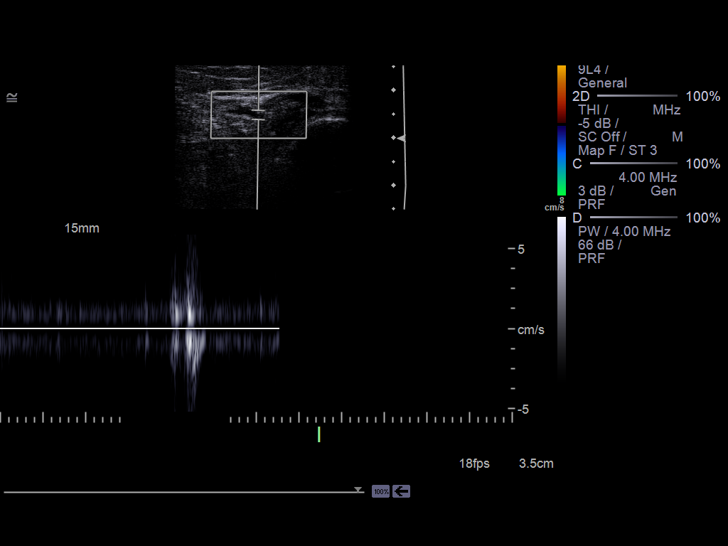
[im 40/49]
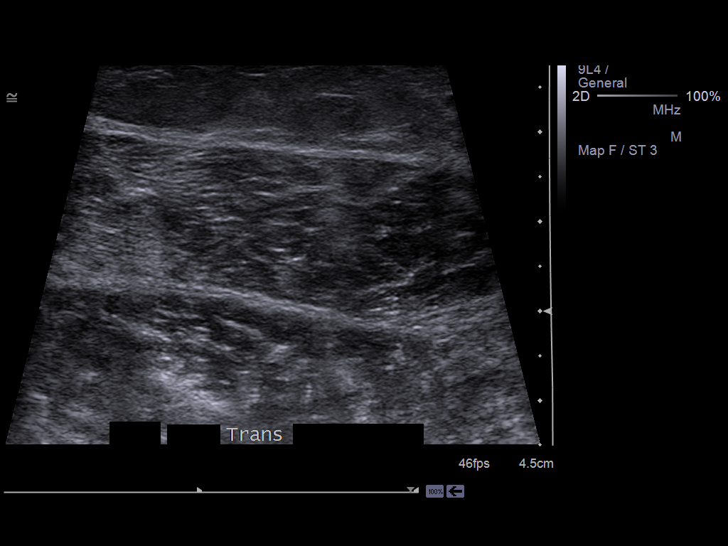
[im 44/49]
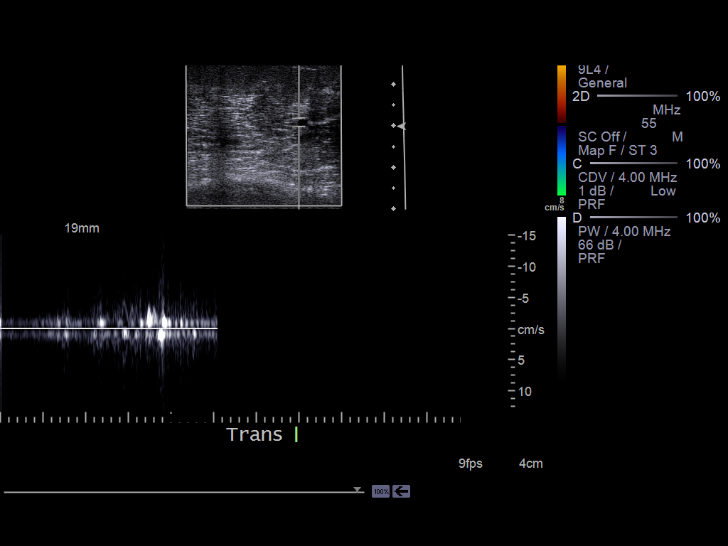
[im 49/49]
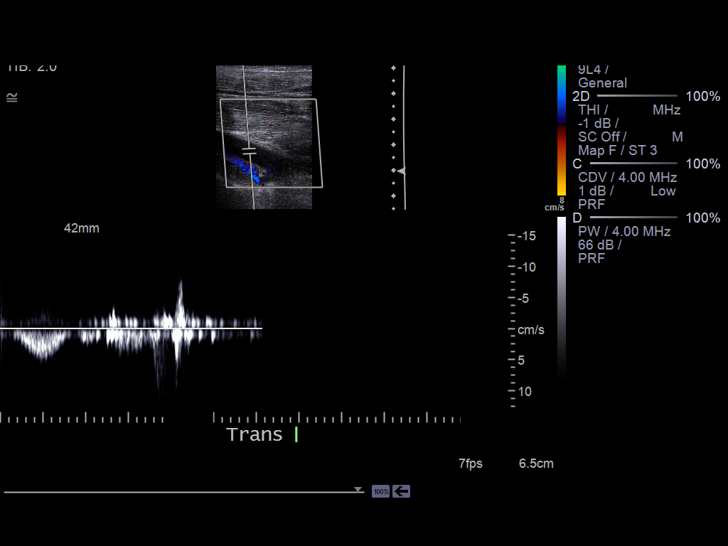

[14 of 24 positions shown; findings below may reference images not displayed]

FINDINGS: Normal compressibility of the common femoral,
superficial femoral, and popliteal veins is demonstrated, as well
as the visualized proximal calf veins.  No filling defects to
suggest DVT on grayscale or color Doppler imaging.  Doppler
waveforms show normal direction of venous flow, normal respiratory
phasicity and response to augmentation.
IMPRESSION: No evidence of lower extremity deep vein thrombosis.

## 2012-07-21 ENCOUNTER — Encounter (HOSPITAL_COMMUNITY): Payer: Self-pay

## 2012-07-21 ENCOUNTER — Emergency Department (HOSPITAL_COMMUNITY)
Admission: EM | Admit: 2012-07-21 | Discharge: 2012-07-21 | Disposition: A | Discharge status: Home / Self Care | Payer: Medicaid Other | Attending: Emergency Medicine | Admitting: Emergency Medicine

## 2012-07-21 DIAGNOSIS — L299 Pruritus, unspecified: Secondary | ICD-10-CM | POA: Insufficient documentation

## 2012-07-21 DIAGNOSIS — F17200 Nicotine dependence, unspecified, uncomplicated: Secondary | ICD-10-CM | POA: Insufficient documentation

## 2012-07-21 DIAGNOSIS — L255 Unspecified contact dermatitis due to plants, except food: Secondary | ICD-10-CM | POA: Insufficient documentation

## 2012-07-21 MED ORDER — PREDNISONE 50 MG PO TABS
60.0000 mg | ORAL_TABLET | Freq: Once | ORAL | Status: AC
  Administered 2012-07-21: 60 mg via ORAL
  Filled 2012-07-21: qty 1

## 2012-07-21 MED ORDER — PREDNISONE 10 MG PO TABS: ORAL_TABLET | ORAL | Status: DC

## 2012-07-21 MED ORDER — DIPHENHYDRAMINE HCL 25 MG PO TABS: 25.0000 mg | ORAL_TABLET | Freq: Four times a day (QID) | ORAL | Status: DC | PRN

## 2012-07-21 MED ORDER — DIPHENHYDRAMINE HCL 25 MG PO CAPS
25.0000 mg | ORAL_CAPSULE | Freq: Once | ORAL | Status: AC
  Administered 2012-07-21: 25 mg via ORAL
  Filled 2012-07-21: qty 1

## 2012-07-21 NOTE — ED Notes (Signed)
Working outside last week, has poison ivy on legs, arms, back. Getting worse. No relief with calamin

## 2012-07-21 NOTE — ED Provider Notes (Signed)
History    CSN: [id number] Arrival date & time 07/21/12  2141  First MD Initiated Contact with Patient 07/21/12 2313     Chief Complaint  Patient presents with  . Rash   (Consider location/radiation/quality/duration/timing/severity/associated sxs/prior Treatment) HPI Comments: Jaime Vaughn is a 32 y.o. female who presents to the Emergency Department complaining of rash to her legs, arms and back.  States the rash has been present for one week.  Symptoms began after working outside and she believes she was exposed to poison oak.  C/o itching and burning to the affected areas.  She has used Calamine lotion without relief.  She denies fever, chills, joint pains, facial swelling or difficulty swallowing or breathing.    The history is provided by the patient.   Past Medical History  Diagnosis Date  . Narcotic abuse    Past Surgical History  Procedure Laterality Date  . Dilation and curettage of uterus      1.48mo ago  . Wisdom tooth extraction      6-64mo ago   No family history on file. History  Substance Use Topics  . Smoking status: Current Every Day Smoker -- 1.00 packs/day for 10 years    Types: Cigarettes  . Smokeless tobacco: Never Used  . Alcohol Use: No   OB History   Grav Para Term Preterm Abortions TAB SAB Ect Mult Living                 Review of Systems  Constitutional: Negative for fever, chills, activity change and appetite change.  HENT: Negative for sore throat, facial swelling, trouble swallowing, neck pain and neck stiffness.   Respiratory: Negative for chest tightness, shortness of breath and wheezing.   Skin: Positive for rash. Negative for wound.  Neurological: Negative for dizziness, weakness, numbness and headaches.  All other systems reviewed and are negative.    Allergies  Tramadol  Home Medications   Current Outpatient Rx  Name  Route  Sig  Dispense  Refill  . ALPRAZolam (XANAX) 0.5 MG tablet   Oral   Take 0.5 mg by mouth 3 (three)  times daily as needed. For anxiety          . Norethindrone-Ethinyl Estradiol-Fe Biphas (LO LOESTRIN FE) 1 MG-10 MCG / 10 MCG tablet   Oral   Take 1 tablet by mouth every evening.           BP 125/85  Pulse 72  Temp(Src) 98.4 F (36.9 C) (Oral)  Resp 20  Ht 5\' 7"  (1.702 m)  Wt 135 lb (61.236 kg)  BMI 21.14 kg/m2  SpO2 98%  LMP 06/30/2012 Physical Exam  Nursing note and vitals reviewed. Constitutional: She is oriented to person, place, and time. She appears well-developed and well-nourished. No distress.  HENT:  Head: Normocephalic and atraumatic.  Mouth/Throat: Uvula is midline, oropharynx is clear and moist and mucous membranes are normal.  Neck: Normal range of motion. Neck supple.  Cardiovascular: Normal rate, regular rhythm and normal heart sounds.   Pulmonary/Chest: Effort normal and breath sounds normal. No respiratory distress. She has no wheezes.  Musculoskeletal: She exhibits no edema and no tenderness.  Lymphadenopathy:    She has no cervical adenopathy.  Neurological: She is alert and oriented to person, place, and time. She exhibits normal muscle tone. Coordination normal.  Skin: Skin is warm. Rash noted. There is erythema.  Erythematous, vesicular rash to the bilateral upper and lower extremities.  Few scattered vesicles to the mid  back.  No edema or drainage.      ED Course  Procedures (including critical care time) Labs Reviewed - No data to display   MDM     Scattered vesicles in a linear pattern to the bilateral lower legs.  Similar appearing vesicles to the UE's, and mid back ,  Airway patent .  No edema.  Few areas of excoriation.  No clinical signs of infection.  I will treated with prednisone taper and benadryl.  Pt agrees to close f/u if needed.  Patient requesting pain medication, I advised her to take tylenol or ibuprofen if needed and that narcotics were not indicated for a rash.  Elaisha Zahniser L. Trisha Mangle, PA-C 07/22/12 1842

## 2012-07-22 NOTE — ED Provider Notes (Signed)
Medical screening examination/treatment/procedure(s) were performed by non-physician practitioner and as supervising physician I was immediately available for consultation/collaboration. Devoria Albe, MD, Armando Gang   Ward Givens, MD 07/22/12 [phone]

## 2012-08-07 ENCOUNTER — Telehealth: Payer: Self-pay | Admitting: Nurse Practitioner

## 2012-08-09 NOTE — Telephone Encounter (Signed)
NTBS.

## 2012-08-11 NOTE — Telephone Encounter (Signed)
Patient notified. Will call back if appt needed

## 2012-11-06 ENCOUNTER — Encounter (INDEPENDENT_AMBULATORY_CARE_PROVIDER_SITE_OTHER): Payer: Self-pay

## 2012-11-06 ENCOUNTER — Telehealth: Payer: Self-pay | Admitting: Nurse Practitioner

## 2012-11-06 ENCOUNTER — Ambulatory Visit (INDEPENDENT_AMBULATORY_CARE_PROVIDER_SITE_OTHER): Payer: Medicaid Other | Admitting: General Practice

## 2012-11-06 VITALS — BP 105/66 | HR 103 | Temp 98.6°F | Wt 162.0 lb

## 2012-11-06 DIAGNOSIS — J029 Acute pharyngitis, unspecified: Secondary | ICD-10-CM

## 2012-11-06 LAB — POCT RAPID STREP A (OFFICE): Rapid Strep A Screen: NEGATIVE

## 2012-11-06 LAB — POCT INFLUENZA A/B
Influenza A, POC: NEGATIVE
Influenza B, POC: NEGATIVE

## 2012-11-06 NOTE — Progress Notes (Signed)
  Subjective:    Patient ID: Jaime Vaughn, female    DOB: [birth year], 32 y.o.   MRN: [id number]  HPI Patient presents today with complaints of muscle aches, fever, and chills. She denies measuring temperature. She reports a sore throat. Reports taking xanax 1mg  by mouth four times daily and hasn't had any in one week. She reports her prescribing physician moved and her drive would be an additional 45 minutes.     Review of Systems  Constitutional: Negative for fever and chills.  Respiratory: Negative for cough, chest tightness, shortness of breath and wheezing.   Cardiovascular: Negative for chest pain and palpitations.  Gastrointestinal: Negative for abdominal pain, diarrhea, constipation and blood in stool.  Genitourinary: Negative for dysuria, hematuria and difficulty urinating.  Musculoskeletal: Negative for back pain, neck pain and neck stiffness.  Neurological: Negative for dizziness, weakness and headaches.       Objective:   Physical Exam  Constitutional: She is oriented to person, place, and time. She appears well-developed and well-nourished.  HENT:  Head: Normocephalic and atraumatic.  Right Ear: External ear normal.  Left Ear: External ear normal.  Nose: Nose normal.  Mouth/Throat: Oropharynx is clear and moist.  Eyes: EOM are normal. Pupils are equal, round, and reactive to light.  Neck: Normal range of motion. Neck supple. No thyromegaly present.  Cardiovascular: Normal rate, regular rhythm and normal heart sounds.   Pulmonary/Chest: Effort normal and breath sounds normal. No respiratory distress. She exhibits no tenderness.  Abdominal: Soft. Bowel sounds are normal. She exhibits no distension. There is no tenderness.  Musculoskeletal: She exhibits no edema and no tenderness.  Lymphadenopathy:    She has no cervical adenopathy.  Neurological: She is alert and oriented to person, place, and time.  Skin: Skin is warm and dry.  Psychiatric: She has a normal mood and  affect.      Results for orders placed in visit on 11/06/12  POCT INFLUENZA A/B      Result Value Range   Influenza A, POC Negative     Influenza B, POC Negative    POCT RAPID STREP A (OFFICE)      Result Value Range   Rapid Strep A Screen Negative  Negative       Assessment & Plan:  1. Sore throat - POCT Influenza A/B - POCT rapid strep A -Increase fluid intake Motrin or tylenol OTC Throat lozenges if help Proper handwashing Gargle with warm salt water Discussed with patient that acute and chronic care could be provided, but pain medications or benzodiazepines wouldn't be prescribed. She would need to see a counselor and a list was provided, so that she may contact at her leisure.  RTO if symptoms worsen Patient verbalized understanding Coralie Keens, FNP-C

## 2012-11-06 NOTE — Telephone Encounter (Signed)
Appt given for today 

## 2012-11-27 ENCOUNTER — Ambulatory Visit (INDEPENDENT_AMBULATORY_CARE_PROVIDER_SITE_OTHER): Payer: Medicaid Other | Admitting: Family Medicine

## 2012-11-27 ENCOUNTER — Encounter: Payer: Self-pay | Admitting: Family Medicine

## 2012-11-27 VITALS — BP 99/69 | HR 81 | Temp 98.0°F | Ht 65.5 in | Wt 155.8 lb

## 2012-11-27 DIAGNOSIS — N12 Tubulo-interstitial nephritis, not specified as acute or chronic: Secondary | ICD-10-CM

## 2012-11-27 LAB — POCT URINALYSIS DIPSTICK
Bilirubin, UA: NEGATIVE
Glucose, UA: NEGATIVE
Ketones, UA: NEGATIVE
Nitrite, UA: NEGATIVE
Spec Grav, UA: 1.02
Urobilinogen, UA: NEGATIVE
pH, UA: 5

## 2012-11-27 LAB — POCT UA - MICROSCOPIC ONLY
Casts, Ur, LPF, POC: NEGATIVE
Crystals, Ur, HPF, POC: NEGATIVE

## 2012-11-27 MED ORDER — SULFAMETHOXAZOLE-TMP DS 800-160 MG PO TABS: 1.0000 | ORAL_TABLET | Freq: Two times a day (BID) | ORAL | Status: DC

## 2012-11-27 MED ORDER — KETOROLAC TROMETHAMINE 30 MG/ML IJ SOLN
30.0000 mg | Freq: Once | INTRAMUSCULAR | Status: AC
  Administered 2012-11-27: 30 mg via INTRAMUSCULAR

## 2012-11-27 MED ORDER — HYDROCODONE-ACETAMINOPHEN 5-325 MG PO TABS: 1.0000 | ORAL_TABLET | Freq: Four times a day (QID) | ORAL | Status: DC | PRN

## 2012-11-27 MED ORDER — CEFTRIAXONE SODIUM 1 G IJ SOLR
1.0000 g | Freq: Once | INTRAMUSCULAR | Status: AC
  Administered 2012-11-27: 1 g via INTRAMUSCULAR

## 2012-11-27 MED ORDER — CEFTRIAXONE SODIUM 1 G IJ SOLR: 1.0000 g | INTRAMUSCULAR | Status: DC

## 2012-11-27 NOTE — Addendum Note (Signed)
Addended by: Lisbeth Ply C on: 11/27/2012 02:44 PM   Modules accepted: Orders

## 2012-11-27 NOTE — Patient Instructions (Addendum)
We will schedule an appointment for you with a urologist. You should hear something within the next few business days. Call our office or go to the ER if your symptoms worsen.  Pyelonephritis, Adult Pyelonephritis is a kidney infection. In general, there are 2 main types of pyelonephritis:  Infections that come on quickly without any warning (acute pyelonephritis).  Infections that persist for a long period of time (chronic pyelonephritis). CAUSES  Two main causes of pyelonephritis are:  Bacteria traveling from the bladder to the kidney. This is a problem especially in pregnant women. The urine in the bladder can become filled with bacteria from multiple causes, including:  Inflammation of the prostate gland (prostatitis).  Sexual intercourse in females.  Bladder infection (cystitis).  Bacteria traveling from the bloodstream to the tissue part of the kidney. Problems that may increase your risk of getting a kidney infection include:  Diabetes.  Kidney stones or bladder stones.  Cancer.  Catheters placed in the bladder.  Other abnormalities of the kidney or ureter. SYMPTOMS   Abdominal pain.  Pain in the side or flank area.  Fever.  Chills.  Upset stomach.  Blood in the urine (dark urine).  Frequent urination.  Strong or persistent urge to urinate.  Burning or stinging when urinating. DIAGNOSIS  Your caregiver may diagnose your kidney infection based on your symptoms. A urine sample may also be taken. TREATMENT  In general, treatment depends on how severe the infection is.   If the infection is mild and caught early, your caregiver may treat you with oral antibiotics and send you home.  If the infection is more severe, the bacteria may have gotten into the bloodstream. This will require intravenous (IV) antibiotics and a hospital stay. Symptoms may include:  High fever.  Severe flank pain.  Shaking chills.  Even after a hospital stay, your caregiver may  require you to be on oral antibiotics for a period of time.  Other treatments may be required depending upon the cause of the infection. HOME CARE INSTRUCTIONS   Take your antibiotics as directed. Finish them even if you start to feel better.  Make an appointment to have your urine checked to make sure the infection is gone.  Drink enough fluids to keep your urine clear or pale yellow.  Take medicines for the bladder if you have urgency and frequency of urination as directed by your caregiver. SEEK IMMEDIATE MEDICAL CARE IF:   You have a fever or persistent symptoms for more than 2-3 days.  You have a fever and your symptoms suddenly get worse.  You are unable to take your antibiotics or fluids.  You develop shaking chills.  You experience extreme weakness or fainting.  There is no improvement after 2 days of treatment. MAKE SURE YOU:  Understand these instructions.  Will watch your condition.  Will get help right away if you are not doing well or get worse. Document Released: 12/31/2004 Document Revised: 07/02/2011 Document Reviewed: 06/06/2010 [facility] Patient Information 2014 [place], [place].   Ketorolac injection What is this medicine? KETOROLAC (kee toe ROLE ak) is a non-steroidal anti-inflammatory drug (NSAID). It is used to treat moderate to severe pain for up to 5 days. It is commonly used after surgery. This medicine should not be used for more than 5 days. This medicine may be used for other purposes; ask your health care provider or pharmacist if you have questions. COMMON BRAND NAME(S): Toradol What should I tell my health care provider before I take  this medicine? They need to know if you have any of these conditions: -asthma, especially aspirin-sensitive asthma -bleeding problems -kidney disease -stomach bleed, ulcer, or other problem -taking aspirin, other NSAID, or probenecid -an unusual or allergic reaction to ketorolac, tromethamine, aspirin, other  NSAIDs, other medicines, foods, dyes or preservatives -pregnant or trying to get pregnant -breast-feeding How should I use this medicine? This medicine is for injection into a muscle or into a vein. It is given by a health care professional in a hospital or clinic setting. Talk to your pediatrician regarding the use of this medicine in children. While this drug may be prescribed for children as young as 59 years old for selected conditions, precautions do apply. Patients over 66 years old may have a stronger reaction and need a smaller dose. Overdosage: If you think you have taken too much of this medicine contact a poison control center or emergency room at once. NOTE: This medicine is only for you. Do not share this medicine with others. What if I miss a dose? This does not apply. What may interact with this medicine? Do not take this medicine with any of the following medications: -aspirin and aspirin-like medicines -cidofovir -methotrexate -NSAIDs, medicines for pain and inflammation, like ibuprofen or naproxen -pentoxifylline -probenecid This medicine may also interact with the following medications: -alcohol -alendronate -alprazolam -carbamazepine -diuretics -flavocoxid -fluoxetine -ginkgo -lithium -medicines for blood pressure like enalapril -medicines that affect platelets like pentoxifylline -medicines that treat or prevent blood clots like heparin, warfarin -muscle relaxants -pemetrexed -phenytoin -thiothixene This list may not describe all possible interactions. Give your health care provider a list of all the medicines, herbs, non-prescription drugs, or dietary supplements you use. Also tell them if you smoke, drink alcohol, or use illegal drugs. Some items may interact with your medicine. What should I watch for while using this medicine? Tell your doctor or healthcare professional if your symptoms do not start to get better or if they get worse. This medicine does  not prevent heart attack or stroke. In fact, this medicine may increase the chance of a heart attack or stroke. The chance may increase with longer use of this medicine and in people who have heart disease. If you take aspirin to prevent heart attack or stroke, talk with your doctor or health care professional. Do not take medicines such as ibuprofen and naproxen with this medicine. Side effects such as stomach upset, nausea, or ulcers may be more likely to occur. Many medicines available without a prescription should not be taken with this medicine. This medicine can cause ulcers and bleeding in the stomach and intestines at any time during treatment. Do not smoke cigarettes or drink alcohol. These increase irritation to your stomach and can make it more susceptible to damage from this medicine. Ulcers and bleeding can happen without warning symptoms and can cause death. This medicine can cause you to bleed more easily. Try to avoid damage to your teeth and gums when you brush or floss your teeth. What side effects may I notice from receiving this medicine? Side effects that you should report to your doctor or health care professional as soon as possible: -allergic reactions like skin rash, itching or hives, swelling of the face, lips, or tongue -black or tarry stools -breathing problems -changes in vision -chest pain -high blood pressure -nausea, vomiting -redness, blistering, peeling or loosening of the skin, including inside the mouth -severe abdominal pain -slurred speech or weakness on one side of the body -trouble passing  urine or change in the amount of urine -unexplained weight gain or swelling -unusual bleeding or bruising -unusually weak or tired -yellowing of eyes or skin Side effects that usually do not require medical attention (report to your doctor or health care professional if they continue or are bothersome): -diarrhea -dizziness -headache -heartburn This list may not  describe all possible side effects. Call your doctor for medical advice about side effects. You may report side effects to FDA at 1-800-FDA-1088. Where should I keep my medicine? This drug is given in a hospital or clinic and will not be stored at home. NOTE: This sheet is a summary. It may not cover all possible information. If you have questions about this medicine, talk to your doctor, pharmacist, or health care provider.  2014, Elsevier/Gold Standard. (2007-05-21 17:24:50)   Ceftriaxone injection What is this medicine? CEFTRIAXONE (sef try AX one) is a cephalosporin antibiotic. It is used to treat certain kinds of bacterial infections. It will not work for colds, flu, or other viral infections. This medicine may be used for other purposes; ask your health care provider or pharmacist if you have questions. COMMON BRAND NAME(S): Rocephin What should I tell my health care provider before I take this medicine? They need to know if you have any of these conditions: -any chronic illness -bowel disease, like colitis -both kidney and liver disease -high bilirubin level in newborn patients -an unusual or allergic reaction to ceftriaxone, other cephalosporin or penicillin antibiotics, foods, dyes or preservatives -pregnant or trying to get pregnant -breast-feeding How should I use this medicine? This medicine is injected into a muscle or infused it into a vein. It is usually given in a medical office or clinic. If you are to give this medicine you will be taught how to inject it. Follow instructions carefully. Use your doses at regular intervals. Do not take your medicine more often than directed. Do not skip doses or stop your medicine early even if you feel better. Do not stop taking except on your doctor's advice. Talk to your pediatrician regarding the use of this medicine in children. Special care may be needed. Overdosage: If you think you have taken too much of this medicine contact a  poison control center or emergency room at once. NOTE: This medicine is only for you. Do not share this medicine with others. What if I miss a dose? If you miss a dose, take it as soon as you can. If it is almost time for your next dose, take only that dose. Do not take double or extra doses. What may interact with this medicine? Do not take this medicine with any of the following medications: -intravenous calcium This list may not describe all possible interactions. Give your health care provider a list of all the medicines, herbs, non-prescription drugs, or dietary supplements you use. Also tell them if you smoke, drink alcohol, or use illegal drugs. Some items may interact with your medicine. What should I watch for while using this medicine? Tell your doctor or health care professional if your symptoms do not improve or if they get worse. Do not treat diarrhea with over the counter products. Contact your doctor if you have diarrhea that lasts more than 2 days or if it is severe and watery. If you are being treated for a sexually transmitted disease, avoid sexual contact until you have finished your treatment. Having sex can infect your sexual partner. Calcium may bind to this medicine and cause lung or kidney problems.  Avoid calcium products while taking this medicine and for 48 hours after taking the last dose of this medicine. What side effects may I notice from receiving this medicine? Side effects that you should report to your doctor or health care professional as soon as possible: -allergic reactions like skin rash, itching or hives, swelling of the face, lips, or tongue -breathing problems -fever, chills -irregular heartbeat -pain when passing urine -seizures -stomach pain, cramps -unusual bleeding, bruising -unusually weak or tired Side effects that usually do not require medical attention (report to your doctor or health care professional if they continue or are  bothersome): -diarrhea -dizzy, drowsy -headache -nausea, vomiting -pain, swelling, irritation where injected -stomach upset -sweating This list may not describe all possible side effects. Call your doctor for medical advice about side effects. You may report side effects to FDA at 1-800-FDA-1088. Where should I keep my medicine? Keep out of the reach of children. Store at room temperature below 25 degrees C (77 degrees F). Protect from light. Throw away any unused vials after the expiration date. NOTE: This sheet is a summary. It may not cover all possible information. If you have questions about this medicine, talk to your doctor, pharmacist, or health care provider.  2014, Elsevier/Gold Standard. (2012-01-13 15:34:57)

## 2012-11-27 NOTE — Progress Notes (Signed)
  Subjective:    Patient ID: Jaime Vaughn, female    DOB: 1980-05-04, 32 y.o.   MRN: [id number]  HPI This 32 y.o. female presents for evaluation of recent hospitalization. She was seen and admitted for pyelonephritis. She has back pain again  And she is feeling weak and tired.  She feels like she is getting sick again She was dx with SIRS and pyelopnephritis.  She was DC'd from hospital a week Ago.  She finished up a rx of keflex. Her ua cx in the hospital is sens to rocephin..   Review of Systems C/o dysuria and back pain. No chest pain, SOB, HA, dizziness, vision change, N/V, diarrhea, constipation, dysuria, urinary urgency or frequency, myalgias, arthralgias or rash.     Objective:   Physical Exam  Vital signs noted  Well developed well nourished female.  HEENT - Head atraumatic Normocephalic                Eyes - PERRLA, Conjuctiva - clear Sclera- Clear EOMI                Ears - EAC's Wnl TM's Wnl Gross Hearing WNL                Nose - Nares patent  Respiratory - Lungs CTA bilateral Cardiac - RRR S1 and S2 without murmur GI - Abdomen soft Nontender and bowel sounds active x 4 MS - CVA tenderness bilateral      Assessment & Plan:  Pyelonephritis - Plan: POCT urinalysis dipstick, POCT UA - Microscopic Only, Urine culture, sulfamethoxazole-trimethoprim (BACTRIM DS) 800-160 MG per tablet, HYDROcodone-acetaminophen (NORCO) 5-325 MG per tablet, ketorolac (TORADOL) 30 MG/ML injection 30 mg, Ambulatory referral to Urology, cefTRIAXone (ROCEPHIN) injection 1 g, DISCONTINUED: cefTRIAXone (ROCEPHIN) injection 1 g  Follow up PRN  Deatra Canter FNP

## 2012-11-29 LAB — URINE CULTURE

## 2012-12-16 ENCOUNTER — Other Ambulatory Visit: Payer: Self-pay | Admitting: Family Medicine

## 2012-12-16 ENCOUNTER — Telehealth: Payer: Self-pay | Admitting: Family Medicine

## 2012-12-16 NOTE — Telephone Encounter (Signed)
She will need to be seen for refill on her lortab

## 2012-12-16 NOTE — Telephone Encounter (Signed)
Pt notified and appt made for thursday

## 2012-12-16 NOTE — Telephone Encounter (Signed)
Lm to call back

## 2012-12-17 ENCOUNTER — Ambulatory Visit (INDEPENDENT_AMBULATORY_CARE_PROVIDER_SITE_OTHER): Payer: Medicaid Other | Admitting: Family Medicine

## 2012-12-17 VITALS — BP 106/63 | HR 68 | Temp 98.1°F | Wt 160.8 lb

## 2012-12-17 DIAGNOSIS — M549 Dorsalgia, unspecified: Secondary | ICD-10-CM

## 2012-12-17 DIAGNOSIS — N390 Urinary tract infection, site not specified: Secondary | ICD-10-CM

## 2012-12-17 DIAGNOSIS — N12 Tubulo-interstitial nephritis, not specified as acute or chronic: Secondary | ICD-10-CM

## 2012-12-17 LAB — POCT URINALYSIS DIPSTICK
Bilirubin, UA: NEGATIVE
Glucose, UA: NEGATIVE
Ketones, UA: NEGATIVE
Nitrite, UA: NEGATIVE
Protein, UA: NEGATIVE
Spec Grav, UA: 1.01
Urobilinogen, UA: NEGATIVE
pH, UA: 6

## 2012-12-17 MED ORDER — SULFAMETHOXAZOLE-TMP DS 800-160 MG PO TABS: 1.0000 | ORAL_TABLET | Freq: Two times a day (BID) | ORAL | Status: DC

## 2012-12-17 NOTE — Progress Notes (Signed)
   Subjective:    Patient ID: Jaime Vaughn, female    DOB: [birth year], 32 y.o.   MRN: [id number]  HPI This 32 y.o. female presents for evaluation of dysuria.  She has been Having frequent UTI's and she has been tx'd recently for pyelonephritis. She has referral to Urology next month.   Review of Systems C/o dysuria No chest pain, SOB, HA, dizziness, vision change, N/V, diarrhea, constipation, myalgias, arthralgias or rash.     Objective:   Physical Exam  Vital signs noted  Well developed well nourished female.  HEENT - Head atraumatic Normocephalic Respiratory - Lungs CTA bilateral Cardiac - RRR S1 and S2 without murmur GI - Abdomen soft Nontender and bowel sounds active x 4 Extremities - No edema. Neuro - Grossly intact.      Results for orders placed in visit on 11/27/12  URINE CULTURE      Result Value Range   Urine Culture, Routine Final report     Result 1 Comment    POCT URINALYSIS DIPSTICK      Result Value Range   Color, UA yellow     Clarity, UA clear.     Glucose, UA neg     Bilirubin, UA neg     Ketones, UA neg     Spec Grav, UA 1.020     Blood, UA trace     pH, UA 5.0     Protein, UA small     Urobilinogen, UA negative     Nitrite, UA neg     Leukocytes, UA Trace    POCT UA - MICROSCOPIC ONLY      Result Value Range   WBC, Ur, HPF, POC 15-25     RBC, urine, microscopic 10-15     Bacteria, U Microscopic mod     Mucus, UA trace     Epithelial cells, urine per micros occ     Crystals, Ur, HPF, POC neg     Casts, Ur, LPF, POC neg     Assessment & Plan:    UTI (lower urinary tract infection) - Plan: Urine culture, sulfamethoxazole-trimethoprim (BACTRIM DS) 800-160 MG per tablet.  Push po fluids, rest, tylenol and motrin otc prn as directed for fever, arthralgias, and myalgias.  Follow up prn if sx's continue or persist.  Deatra Canter FNP

## 2012-12-17 NOTE — Patient Instructions (Signed)
Urinary Tract Infection  Urinary tract infections (UTIs) can develop anywhere along your urinary tract. Your urinary tract is your body's drainage system for removing wastes and extra water. Your urinary tract includes two kidneys, two ureters, a bladder, and a urethra. Your kidneys are a pair of bean-shaped organs. Each kidney is about the size of your fist. They are located below your ribs, one on each side of your spine.  CAUSES  Infections are caused by microbes, which are microscopic organisms, including fungi, viruses, and bacteria. These organisms are so small that they can only be seen through a microscope. Bacteria are the microbes that most commonly cause UTIs.  SYMPTOMS   Symptoms of UTIs may vary by age and gender of the patient and by the location of the infection. Symptoms in young women typically include a frequent and intense urge to urinate and a painful, burning feeling in the bladder or urethra during urination. Older women and men are more likely to be tired, shaky, and weak and have muscle aches and abdominal pain. A fever may mean the infection is in your kidneys. Other symptoms of a kidney infection include pain in your back or sides below the ribs, nausea, and vomiting.  DIAGNOSIS  To diagnose a UTI, your caregiver will ask you about your symptoms. Your caregiver also will ask to provide a urine sample. The urine sample will be tested for bacteria and white blood cells. White blood cells are made by your body to help fight infection.  TREATMENT   Typically, UTIs can be treated with medication. Because most UTIs are caused by a bacterial infection, they usually can be treated with the use of antibiotics. The choice of antibiotic and length of treatment depend on your symptoms and the type of bacteria causing your infection.  HOME CARE INSTRUCTIONS   If you were prescribed antibiotics, take them exactly as your caregiver instructs you. Finish the medication even if you feel better after you  have only taken some of the medication.   Drink enough water and fluids to keep your urine clear or pale yellow.   Avoid caffeine, tea, and carbonated beverages. They tend to irritate your bladder.   Empty your bladder often. Avoid holding urine for long periods of time.   Empty your bladder before and after sexual intercourse.   After a bowel movement, women should cleanse from front to back. Use each tissue only once.  SEEK MEDICAL CARE IF:    You have back pain.   You develop a fever.   Your symptoms do not begin to resolve within 3 days.  SEEK IMMEDIATE MEDICAL CARE IF:    You have severe back pain or lower abdominal pain.   You develop chills.   You have nausea or vomiting.   You have continued burning or discomfort with urination.  MAKE SURE YOU:    Understand these instructions.   Will watch your condition.   Will get help right away if you are not doing well or get worse.  Document Released: 10/10/2004 Document Revised: 07/02/2011 Document Reviewed: 02/08/2011  [employer] Patient Information 2014 [employer].

## 2012-12-19 LAB — URINE CULTURE

## 2013-01-19 ENCOUNTER — Ambulatory Visit: Payer: Medicaid Other | Admitting: Urology

## 2013-01-21 ENCOUNTER — Other Ambulatory Visit: Payer: Self-pay | Admitting: Nurse Practitioner

## 2013-01-21 MED ORDER — OSELTAMIVIR PHOSPHATE 75 MG PO CAPS: 75.0000 mg | ORAL_CAPSULE | Freq: Every day | ORAL | Status: DC

## 2013-02-08 ENCOUNTER — Ambulatory Visit: Payer: Medicaid Other | Admitting: Family Medicine

## 2013-02-12 ENCOUNTER — Emergency Department (HOSPITAL_COMMUNITY)
Admission: EM | Admit: 2013-02-12 | Discharge: 2013-02-12 | Disposition: A | Discharge status: Home / Self Care | Payer: MEDICAID | Attending: Emergency Medicine | Admitting: Emergency Medicine

## 2013-02-12 ENCOUNTER — Encounter (HOSPITAL_COMMUNITY): Payer: Self-pay | Admitting: Emergency Medicine

## 2013-02-12 DIAGNOSIS — F410 Panic disorder [episodic paroxysmal anxiety] without agoraphobia: Secondary | ICD-10-CM

## 2013-02-12 DIAGNOSIS — Z792 Long term (current) use of antibiotics: Secondary | ICD-10-CM | POA: Insufficient documentation

## 2013-02-12 DIAGNOSIS — R071 Chest pain on breathing: Secondary | ICD-10-CM | POA: Insufficient documentation

## 2013-02-12 DIAGNOSIS — F17200 Nicotine dependence, unspecified, uncomplicated: Secondary | ICD-10-CM | POA: Insufficient documentation

## 2013-02-12 DIAGNOSIS — Z79899 Other long term (current) drug therapy: Secondary | ICD-10-CM | POA: Insufficient documentation

## 2013-02-12 DIAGNOSIS — Z8659 Personal history of other mental and behavioral disorders: Secondary | ICD-10-CM

## 2013-02-12 HISTORY — DX: Anxiety disorder, unspecified: F41.9

## 2013-02-12 MED ORDER — ALPRAZOLAM 0.5 MG PO TABS: 0.5000 mg | ORAL_TABLET | Freq: Two times a day (BID) | ORAL | Status: DC | PRN

## 2013-02-12 NOTE — ED Provider Notes (Signed)
CSN: [id number]     Arrival date & time 02/12/13  0907 History  This chart was scribed for Geoffery Lyons, MD by Shari Heritage, ED Scribe. The patient was seen in room APA14/APA14. Patient's care was started at 9:27 AM.    Chief Complaint  Patient presents with  . Anxiety   The history is provided by the patient. No language interpreter was used.    HPI Comments: Jaime Vaughn is a 33 y.o. female who presents to the Emergency Department complaining of a panic attack that occurred 7 days ago. She is also complaining of moderate intermittent chest discomfort for the past 7 days since the panic attack. Pain is not worse with breathing. She states that she was diagnosed with anxiety four years ago and has been followed for this by her PCP. Until 3 months ago, she was taking Xanax 0.5 mg daily tid when her PCP moved to [place]. Patient states that she has been unable to get there for follow ups because she does not have transportation. She says that she is trying to find a doctor locally to manage her anxiety. She denies any other symptoms at this time. She has a medical history of narcotic abuse.  Past Medical History  Diagnosis Date  . Narcotic abuse    Past Surgical History  Procedure Laterality Date  . Dilation and curettage of uterus      1.5mo ago  . Wisdom tooth extraction      6-6mo ago   No family history on file. History  Substance Use Topics  . Smoking status: Current Every Day Smoker -- 1.00 packs/day for 10 years    Types: Cigarettes  . Smokeless tobacco: Never Used  . Alcohol Use: No   OB History   Grav Para Term Preterm Abortions TAB SAB Ect Mult Living                 Review of Systems A complete 10 system review of systems was obtained and all systems are negative except as noted in the HPI and PMH.   Allergies  Tramadol  Home Medications   Current Outpatient Rx  Name  Route  Sig  Dispense  Refill  . ALPRAZolam (XANAX) 0.5 MG tablet   Oral   Take 0.5 mg by  mouth 3 (three) times daily as needed. For anxiety          . HYDROcodone-acetaminophen (NORCO) 5-325 MG per tablet   Oral   Take 1 tablet by mouth every 6 (six) hours as needed for moderate pain.   30 tablet   0   . Norethindrone-Ethinyl Estradiol-Fe Biphas (LO LOESTRIN FE) 1 MG-10 MCG / 10 MCG tablet   Oral   Take 1 tablet by mouth every evening.          Marland Kitchen oseltamivir (TAMIFLU) 75 MG capsule   Oral   Take 1 capsule (75 mg total) by mouth daily.   10 capsule   0   . sulfamethoxazole-trimethoprim (BACTRIM DS) 800-160 MG per tablet   Oral   Take 1 tablet by mouth 2 (two) times daily.   28 tablet   0    Triage Vitals: BP 102/55  Pulse 92  Temp(Src) 98.4 F (36.9 C) (Oral)  Resp 16  SpO2 100%  LMP 01/12/2013 Physical Exam  Constitutional: She is oriented to person, place, and time. She appears well-developed and well-nourished.  Appears anxious.  HENT:  Head: Normocephalic and atraumatic.  Eyes: Conjunctivae and EOM are  normal. Pupils are equal, round, and reactive to light.  Neck: Normal range of motion. Neck supple. No thyromegaly present.  Cardiovascular: Normal rate, regular rhythm and normal heart sounds.   Pulmonary/Chest: Effort normal and breath sounds normal. She exhibits tenderness (mild chest wall tenderness to palpation).  Musculoskeletal: Normal range of motion.  Neurological: She is alert and oriented to person, place, and time.  Skin: Skin is warm and dry.  Psychiatric:  Appears anxious.    ED Course  Procedures (including critical care time) DIAGNOSTIC STUDIES: Oxygen Saturation is 100% on room air, normal by my interpretation.    COORDINATION OF CARE: 9:31 AM- Low suspicion for acute cardiac disease. EKG is unremarkable. Will write for short course of Xanax but encouraged her to follow up with PCP for regular care. Patient informed of current plan for treatment and evaluation and agrees with plan at this time.      MDM  No diagnosis  found. Patient presents with complaints of panic attacks. She states she was on a regular dose of Xanax until 3 months ago when her primary Dr. left found. She has been getting by reasonably well until the past couple of weeks. She states she has had several panic attacks in that period of time. She appears stable. I have agreed to write a small number of Xanax for her but have advised her to obtain primary care for future prescription refills.   I personally performed the services described in this documentation, which was scribed in my presence. The recorded information has been reviewed and is accurate.      Geoffery Lyonsouglas Jemiah Ellenburg, MD 02/12/13 [phone]

## 2013-02-12 NOTE — ED Notes (Signed)
Pt states she last had xanax 3 mo. Ago [place] get there. States 3 panic attacks within past 3 months which have also caused her to have near syncopal and syncopal episodes. States pain to chest x 3 mo

## 2013-02-12 NOTE — Discharge Instructions (Signed)
Xanax as prescribed.  Followup with your primary Dr. for future refills.   Panic Attacks Panic attacks are sudden, short-livedsurges of severe anxiety, fear, or discomfort. They may occur for no reason when you are relaxed, when you are anxious, or when you are sleeping. Panic attacks may occur for a number of reasons:   Healthy people occasionally have panic attacks in extreme, life-threatening situations, such as war or natural disasters. Normal anxiety is a protective mechanism of the body that helps [place] react to danger (fight or flight response).  Panic attacks are often seen with anxiety disorders, such as panic disorder, social anxiety disorder, generalized anxiety disorder, and phobias. Anxiety disorders cause excessive or uncontrollable anxiety. They may interfere with your relationships or other life activities.  Panic attacks are sometimes seen with other mental illnesses such as depression and posttraumatic stress disorder.  Certain medical conditions, prescription medicines, and drugs of abuse can cause panic attacks. SYMPTOMS  Panic attacks start suddenly, peak within 20 minutes, and are accompanied by four or more of the following symptoms:  Pounding heart or fast heart rate (palpitations).  Sweating.  Trembling or shaking.  Shortness of breath or feeling smothered.  Feeling choked.  Chest pain or discomfort.  Nausea or strange feeling in your stomach.  Dizziness, lightheadedness, or feeling like you will faint.  Chills or hot flushes.  Numbness or tingling in your lips or hands and feet.  Feeling that things are not real or feeling that you are not yourself.  Fear of losing control or going crazy.  Fear of dying. Some of these symptoms can mimic serious medical conditions. For example, you may think you are having a heart attack. Although panic attacks can be very scary, they are not life threatening. DIAGNOSIS  Panic attacks are diagnosed through an  assessment by your health care provider. Your health care provider will ask questions about your symptoms, such as where and when they occurred. Your health care provider will also ask about your medical history and use of alcohol and drugs, including prescription medicines. Your health care provider may order blood tests or other studies to rule out a serious medical condition. Your health care provider may refer you to a mental health professional for further evaluation. TREATMENT   Most healthy people who have one or two panic attacks in an extreme, life-threatening situation will not require treatment.  The treatment for panic attacks associated with anxiety disorders or other mental illness typically involves counseling with a mental health professional, medicine, or a combination of both. Your health care provider will help determine what treatment is best for you.  Panic attacks due to physical illness usually goes away with treatment of the illness. If prescription medicine is causing panic attacks, talk with your health care provider about stopping the medicine, decreasing the dose, or substituting another medicine.  Panic attacks due to alcohol or drug abuse goes away with abstinence. Some adults need professional help in order to stop drinking or using drugs. HOME CARE INSTRUCTIONS   Take all your medicines as prescribed.   Check with your health care provider before starting new prescription or over-the-counter medicines.  Keep all follow up appointments with your health care provider. SEEK MEDICAL CARE IF:  You are not able to take your medicines as prescribed.  Your symptoms do not improve or get worse. SEEK IMMEDIATE MEDICAL CARE IF:   You experience panic attack symptoms that are different than your usual symptoms.  You have serious thoughts  about hurting yourself or others.  You are taking medicine for panic attacks and have a serious side effect. MAKE SURE  YOU:  Understand these instructions.  Will watch your condition.  Will get help right away if you are not doing well or get worse. Document Released: 12/31/2004 Document Revised: 10/21/2012 Document Reviewed: 08/14/2012 [facility] Patient Information 2014 [place], [place].

## 2013-06-02 ENCOUNTER — Ambulatory Visit: Payer: Medicaid Other | Admitting: Family Medicine

## 2013-07-02 ENCOUNTER — Telehealth: Payer: Self-pay | Admitting: Family Medicine

## 2013-07-02 ENCOUNTER — Ambulatory Visit (INDEPENDENT_AMBULATORY_CARE_PROVIDER_SITE_OTHER): Payer: Medicaid Other | Admitting: Family

## 2013-07-02 ENCOUNTER — Encounter: Payer: Self-pay | Admitting: Family

## 2013-07-02 VITALS — BP 110/72 | HR 83 | Temp 99.5°F | Ht 65.5 in | Wt 167.0 lb

## 2013-07-02 DIAGNOSIS — R300 Dysuria: Secondary | ICD-10-CM

## 2013-07-02 DIAGNOSIS — N10 Acute tubulo-interstitial nephritis: Secondary | ICD-10-CM

## 2013-07-02 DIAGNOSIS — R109 Unspecified abdominal pain: Secondary | ICD-10-CM

## 2013-07-02 LAB — POCT URINALYSIS DIPSTICK
BILIRUBIN UA: NEGATIVE
Glucose, UA: NEGATIVE
Ketones, UA: NEGATIVE
NITRITE UA: POSITIVE
PH UA: 6.5
Protein, UA: NEGATIVE
Spec Grav, UA: 1.01
Urobilinogen, UA: NEGATIVE

## 2013-07-02 LAB — POCT UA - MICROSCOPIC ONLY
CASTS, UR, LPF, POC: NEGATIVE
Crystals, Ur, HPF, POC: NEGATIVE
MUCUS UA: NEGATIVE
Yeast, UA: NEGATIVE

## 2013-07-02 LAB — POCT URINE PREGNANCY: Preg Test, Ur: NEGATIVE

## 2013-07-02 MED ORDER — HYDROCODONE-ACETAMINOPHEN 5-325 MG PO TABS: 1.0000 | ORAL_TABLET | Freq: Four times a day (QID) | ORAL | Status: DC | PRN

## 2013-07-02 MED ORDER — KETOROLAC TROMETHAMINE 30 MG/ML IJ SOLN
30.0000 mg | Freq: Once | INTRAMUSCULAR | Status: AC
  Administered 2013-07-02: 30 mg via INTRAMUSCULAR

## 2013-07-02 MED ORDER — CIPROFLOXACIN HCL 500 MG PO TABS: 500.0000 mg | ORAL_TABLET | Freq: Two times a day (BID) | ORAL | Status: DC

## 2013-07-02 NOTE — Patient Instructions (Addendum)
Pyelonephritis, Adult Pyelonephritis is a kidney infection. In general, there are 2 main types of pyelonephritis:  Infections that come on quickly without any warning (acute pyelonephritis).  Infections that persist for a long period of time (chronic pyelonephritis). CAUSES  Two main causes of pyelonephritis are:  Bacteria traveling from the bladder to the kidney. This is a problem especially in pregnant women. The urine in the bladder can become filled with bacteria from multiple causes, including:  Inflammation of the prostate gland (prostatitis).  Sexual intercourse in females.  Bladder infection (cystitis).  Bacteria traveling from the bloodstream to the tissue part of the kidney. Problems that may increase your risk of getting a kidney infection include:  Diabetes.  Kidney stones or bladder stones.  Cancer.  Catheters placed in the bladder.  Other abnormalities of the kidney or ureter. SYMPTOMS   Abdominal pain.  Pain in the side or flank area.  Fever.  Chills.  Upset stomach.  Blood in the urine (dark urine).  Frequent urination.  Strong or persistent urge to urinate.  Burning or stinging when urinating. DIAGNOSIS  Your caregiver may diagnose your kidney infection based on your symptoms. A urine sample may also be taken. TREATMENT  In general, treatment depends on how severe the infection is.   If the infection is mild and caught early, your caregiver may treat you with oral antibiotics and send you home.  If the infection is more severe, the bacteria may have gotten into the bloodstream. This will require intravenous (IV) antibiotics and a hospital stay. Symptoms may include:  High fever.  Severe flank pain.  Shaking chills.  Even after a hospital stay, your caregiver may require you to be on oral antibiotics for a period of time.  Other treatments may be required depending upon the cause of the infection. HOME CARE INSTRUCTIONS   Take your  antibiotics as directed. Finish them even if you start to feel better.  Make an appointment to have your urine checked to make sure the infection is gone.  Drink enough fluids to keep your urine clear or pale yellow.  Take medicines for the bladder if you have urgency and frequency of urination as directed by your caregiver. SEEK IMMEDIATE MEDICAL CARE IF:   You have a fever or persistent symptoms for more than 2-3 days.  You have a fever and your symptoms suddenly get worse.  You are unable to take your antibiotics or fluids.  You develop shaking chills.  You experience extreme weakness or fainting.  There is no improvement after 2 days of treatment. MAKE SURE YOU:  Understand these instructions.  Will watch your condition.  Will get help right away if you are not doing well or get worse. Document Released: 12/31/2004 Document Revised: 07/02/2011 Document Reviewed: 06/06/2010 [facility] Patient Information 2015 [place], [place]. This information is not intended to replace advice given to you by your health care provider. Make sure you discuss any questions you have with your health care provider.   Ketorolac injection What is this medicine? KETOROLAC (kee toe ROLE ak) is a non-steroidal anti-inflammatory drug (NSAID). It is used to treat moderate to severe pain for up to 5 days. It is commonly used after surgery. This medicine should not be used for more than 5 days. This medicine may be used for other purposes; ask your health care provider or pharmacist if you have questions. COMMON BRAND NAME(S): Toradol What should I tell my health care provider before I take this medicine? They need  to know if you have any of these conditions: -asthma, especially aspirin-sensitive asthma -bleeding problems -kidney disease -stomach bleed, ulcer, or other problem -taking aspirin, other NSAID, or probenecid -an unusual or allergic reaction to ketorolac, tromethamine, aspirin, other NSAIDs,  other medicines, foods, dyes or preservatives -pregnant or trying to get pregnant -breast-feeding How should I use this medicine? This medicine is for injection into a muscle or into a vein. It is given by a health care professional in a hospital or clinic setting. Talk to your pediatrician regarding the use of this medicine in children. While this drug may be prescribed for children as young as 6 years old for selected conditions, precautions do apply. Patients over 29 years old may have a stronger reaction and need a smaller dose. Overdosage: If you think you have taken too much of this medicine contact a poison control center or emergency room at once. NOTE: This medicine is only for you. Do not share this medicine with others. What if I miss a dose? This does not apply. What may interact with this medicine? Do not take this medicine with any of the following medications: -aspirin and aspirin-like medicines -cidofovir -methotrexate -NSAIDs, medicines for pain and inflammation, like ibuprofen or naproxen -pentoxifylline -probenecid This medicine may also interact with the following medications: -alcohol -alendronate -alprazolam -carbamazepine -diuretics -flavocoxid -fluoxetine -ginkgo -lithium -medicines for blood pressure like enalapril -medicines that affect platelets like pentoxifylline -medicines that treat or prevent blood clots like heparin, warfarin -muscle relaxants -pemetrexed -phenytoin -thiothixene This list may not describe all possible interactions. Give your health care provider a list of all the medicines, herbs, non-prescription drugs, or dietary supplements you use. Also tell them if you smoke, drink alcohol, or use illegal drugs. Some items may interact with your medicine. What should I watch for while using this medicine? Tell your doctor or healthcare professional if your symptoms do not start to get better or if they get worse. This medicine does not  prevent heart attack or stroke. In fact, this medicine may increase the chance of a heart attack or stroke. The chance may increase with longer use of this medicine and in people who have heart disease. If you take aspirin to prevent heart attack or stroke, talk with your doctor or health care professional. Do not take medicines such as ibuprofen and naproxen with this medicine. Side effects such as stomach upset, nausea, or ulcers may be more likely to occur. Many medicines available without a prescription should not be taken with this medicine. This medicine can cause ulcers and bleeding in the stomach and intestines at any time during treatment. Do not smoke cigarettes or drink alcohol. These increase irritation to your stomach and can make it more susceptible to damage from this medicine. Ulcers and bleeding can happen without warning symptoms and can cause death. This medicine can cause you to bleed more easily. Try to avoid damage to your teeth and gums when you brush or floss your teeth. What side effects may I notice from receiving this medicine? Side effects that you should report to your doctor or health care professional as soon as possible: -allergic reactions like skin rash, itching or hives, swelling of the face, lips, or tongue -breathing problems -high blood pressure -nausea, vomiting -redness, blistering, peeling or loosening of the skin, including inside the mouth -severe stomach pain -signs and symptoms of bleeding such as bloody or black, tarry stools; red or dark-[place] urine; spitting up blood or [place] material that looks like  coffee grounds; red spots on the skin; unusual bruising or bleeding from the eye, gums, or nose -signs and symptoms of a blood clot changes in vision; chest pain; severe, sudden headache; trouble speaking; sudden numbness or weakness of the face, arm, or leg -trouble passing urine or change in the amount of urine -unexplained weight gain or  swelling -unusually weak or tired -yellowing of eyes or skin Side effects that usually do not require medical attention (report to your doctor or health care professional if they continue or are bothersome): -diarrhea -dizziness -headache -heartburn This list may not describe all possible side effects. Call your doctor for medical advice about side effects. You may report side effects to FDA at 1-800-FDA-1088. Where should I keep my medicine? This drug is given in a hospital or clinic and will not be stored at home. NOTE: This sheet is a summary. It may not cover all possible information. If you have questions about this medicine, talk to your doctor, pharmacist, or health care provider.  2015, [employer]vier/Gold Standard. (2012-05-19 16:34:56)

## 2013-07-02 NOTE — Progress Notes (Signed)
Subjective:    Patient ID: Jaime JacobsonAprel D Lasseigne, female    DOB: 06/01/1980, 33 y.o.   MRN: [id number]  Urinary Tract Infection  This is a new problem. The current episode started in the past 7 days. The problem occurs every urination. The problem has been gradually worsening. The quality of the pain is described as burning. The pain is at a severity of 10/10. The pain is moderate. There has been no fever. There is a history of pyelonephritis. Associated symptoms include flank pain, hematuria and hesitancy. Pertinent negatives include no nausea, urgency or vomiting. She has tried acetaminophen (AZO ) for the symptoms. The treatment provided mild relief. Her past medical history is significant for recurrent UTIs. There is no history of kidney stones.      Review of Systems  HENT: Negative.   Respiratory: Negative.   Cardiovascular: Negative.   Gastrointestinal: Negative.  Negative for nausea and vomiting.  Genitourinary: Positive for hesitancy, hematuria and flank pain. Negative for urgency.  Neurological: Negative.   Hematological: Negative.   All other systems reviewed and are negative.      Objective:   Physical Exam  Vitals reviewed. Constitutional: She is oriented to person, place, and time. She appears well-developed and well-nourished. No distress.  Cardiovascular: Normal rate, regular rhythm, normal heart sounds and intact distal pulses.   No murmur heard. Pulmonary/Chest: Effort normal and breath sounds normal. No respiratory distress. She has no wheezes.  Abdominal: Soft. Bowel sounds are normal. She exhibits no distension. There is tenderness (mild).  Musculoskeletal: Normal range of motion. She exhibits no edema and no tenderness.  CVA tenderness present   Neurological: She is alert and oriented to person, place, and time. She has normal reflexes. No cranial nerve deficit.  Skin: Skin is warm and dry.  Psychiatric: She has a normal mood and affect. Her behavior is normal.  Judgment and thought content normal.      BP 110/72  Pulse 83  Temp(Src) 99.5 F (37.5 C) (Oral)  Ht 5' 5.5" (1.664 m)  Wt 167 lb (75.751 kg)  BMI 27.36 kg/m2  Results for orders placed in visit on 07/02/13  POCT UA - MICROSCOPIC ONLY      Result Value Ref Range   WBC, Ur, HPF, POC 10-15     RBC, urine, microscopic 5-7     Bacteria, U Microscopic many     Mucus, UA neg     Epithelial cells, urine per micros occ     Crystals, Ur, HPF, POC neg     Casts, Ur, LPF, POC neg     Yeast, UA neg    POCT URINALYSIS DIPSTICK      Result Value Ref Range   Color, UA yellow     Clarity, UA cloudy     Glucose, UA neg     Bilirubin, UA neg     Ketones, UA neg     Spec Grav, UA 1.010     Blood, UA mod     pH, UA 6.5     Protein, UA neg     Urobilinogen, UA negative     Nitrite, UA pos     Leukocytes, UA moderate (2+)         Assessment & Plan:  1. Dysuria - POCT UA - Microscopic Only - POCT urinalysis dipstick - ketorolac (TORADOL) 30 MG/ML injection 30 mg; Inject 1 mL (30 mg total) into the muscle once. - POCT urine pregnancy  2. Acute pyelonephritis Force fluids  AZO over the counter X2 days RTO prn - ketorolac (TORADOL) 30 MG/ML injection 30 mg; Inject 1 mL (30 mg total) into the muscle once. - ciprofloxacin (CIPRO) 500 MG tablet; Take 1 tablet (500 mg total) by mouth 2 (two) times daily.  Dispense: 28 tablet; Refill: 0 - HYDROcodone-acetaminophen (NORCO/VICODIN) 5-325 MG per tablet; Take 1 tablet by mouth every 6 (six) hours as needed for moderate pain.  Dispense: 30 tablet; Refill: 0 - Ambulatory referral to Urology  Jannifer Rodneyhristy Hawks, FNP  - POCT urine pregnancy

## 2014-05-18 ENCOUNTER — Telehealth: Payer: Self-pay

## 2014-05-18 DIAGNOSIS — Z79899 Other long term (current) drug therapy: Principal | ICD-10-CM

## 2014-05-18 DIAGNOSIS — Z5181 Encounter for therapeutic drug level monitoring: Secondary | ICD-10-CM

## 2014-05-18 NOTE — Telephone Encounter (Signed)
Wants a referral to Dr Gerilyn Pilgrimoonquah    Last seen here 07/02/13 Neysa Bonitohristy

## 2014-05-26 ENCOUNTER — Telehealth: Payer: Self-pay | Admitting: Family Medicine

## 2014-07-13 ENCOUNTER — Telehealth: Payer: Self-pay | Admitting: Family

## 2014-07-13 NOTE — Telephone Encounter (Signed)
Pt given appt 7/1 at 10:40 with Tiffany.

## 2014-07-15 ENCOUNTER — Ambulatory Visit: Payer: Medicaid Other | Admitting: Physician Assistant

## 2014-08-09 ENCOUNTER — Encounter: Payer: Self-pay | Admitting: Physician Assistant

## 2014-08-09 ENCOUNTER — Encounter (INDEPENDENT_AMBULATORY_CARE_PROVIDER_SITE_OTHER): Payer: Self-pay

## 2014-08-09 ENCOUNTER — Ambulatory Visit (INDEPENDENT_AMBULATORY_CARE_PROVIDER_SITE_OTHER): Payer: Medicaid Other | Admitting: Physician Assistant

## 2014-08-09 VITALS — BP 94/59 | HR 83 | Temp 97.3°F | Ht 65.5 in | Wt 180.0 lb

## 2014-08-09 DIAGNOSIS — N3090 Cystitis, unspecified without hematuria: Secondary | ICD-10-CM | POA: Diagnosis not present

## 2014-08-09 DIAGNOSIS — M545 Low back pain, unspecified: Secondary | ICD-10-CM

## 2014-08-09 LAB — POCT UA - MICROSCOPIC ONLY
CRYSTALS, UR, HPF, POC: NEGATIVE
Casts, Ur, LPF, POC: NEGATIVE
RBC, URINE, MICROSCOPIC: NEGATIVE
Yeast, UA: NEGATIVE

## 2014-08-09 LAB — POCT URINALYSIS DIPSTICK
Bilirubin, UA: NEGATIVE
Blood, UA: NEGATIVE
Glucose, UA: NEGATIVE
KETONES UA: NEGATIVE
Nitrite, UA: NEGATIVE
PH UA: 5
PROTEIN UA: NEGATIVE
Spec Grav, UA: 1.015
UROBILINOGEN UA: NEGATIVE

## 2014-08-09 MED ORDER — CIPROFLOXACIN HCL 500 MG PO TABS: 500.0000 mg | ORAL_TABLET | Freq: Two times a day (BID) | ORAL | Status: DC

## 2014-08-09 NOTE — Progress Notes (Signed)
   Subjective:    Patient ID: Jaime Vaughn, female    DOB: [birth year], 34 y.o.   MRN: [id number]  HPI 34 y/o female presents with c/o discomfort with urination x 2 days. She states that she is having severe pain in her lower back.     Review of Systems  Constitutional: Negative.   HENT: Negative.   Gastrointestinal: Positive for abdominal pain.  Endocrine: Positive for polyuria.  Genitourinary: Positive for dysuria and urgency. Negative for hematuria.  Musculoskeletal: Positive for back pain (bilateral low back ).       Objective:   Physical Exam  Constitutional: She appears well-developed and well-nourished.  Abdominal: Soft. She exhibits no distension.  CVA ttp on right back   Nursing note and vitals reviewed.  Results for orders placed or performed in visit on 08/09/14  POCT UA - Microscopic Only  Result Value Ref Range   WBC, Ur, HPF, POC 20-25    RBC, urine, microscopic neg    Bacteria, U Microscopic occ    Mucus, UA occ    Epithelial cells, urine per micros occ    Crystals, Ur, HPF, POC neg    Casts, Ur, LPF, POC neg    Yeast, UA neg   POCT urinalysis dipstick  Result Value Ref Range   Color, UA yellow    Clarity, UA cloudy    Glucose, UA neg    Bilirubin, UA neg    Ketones, UA neg    Spec Grav, UA 1.015    Blood, UA neg    pH, UA 5.0    Protein, UA neg    Urobilinogen, UA negative    Nitrite, UA neg    Leukocytes, UA moderate (2+) (A) Negative          Assessment & Plan:  1. Bilateral low back pain without sciatica  - POCT UA - Microscopic Only - POCT urinalysis dipstick - Urine culture - ciprofloxacin (CIPRO) 500 MG tablet; Take 1 tablet (500 mg total) by mouth 2 (two) times daily.  Dispense: 20 tablet; Refill: 0  2. Cystitis  - ciprofloxacin (CIPRO) 500 MG tablet; Take 1 tablet (500 mg total) by mouth 2 (two) times daily.  Dispense: 20 tablet; Refill: 0   Continue all meds Labs pending Health Maintenance reviewed Diet and exercise  encouraged RTO 2 weeks   Starnisha Batrez A. Chauncey Reading PA-C

## 2014-08-11 LAB — URINE CULTURE

## 2014-08-12 ENCOUNTER — Other Ambulatory Visit: Payer: Self-pay | Admitting: Physician Assistant

## 2014-08-12 DIAGNOSIS — N3090 Cystitis, unspecified without hematuria: Secondary | ICD-10-CM

## 2014-08-12 MED ORDER — NITROFURANTOIN MONOHYD MACRO 100 MG PO CAPS: 100.0000 mg | ORAL_CAPSULE | Freq: Two times a day (BID) | ORAL | Status: DC

## 2014-08-15 ENCOUNTER — Telehealth: Payer: Self-pay | Admitting: *Deleted

## 2014-08-15 NOTE — Telephone Encounter (Signed)
Please call to discuss new medication.

## 2014-08-15 NOTE — Telephone Encounter (Signed)
-----   Message from Chelsea Primus, PA-C sent at 08/12/2014  3:39 PM EDT ----- Patient given Cipro in office. Culture is resistant to Cipro so Macrobid has been sent to pharmacy. Stop Cipro and take new rx Tiffany A. Chauncey Reading PA-C

## 2014-08-15 NOTE — Telephone Encounter (Signed)
Aware of new medication. 

## 2014-10-05 ENCOUNTER — Ambulatory Visit (INDEPENDENT_AMBULATORY_CARE_PROVIDER_SITE_OTHER): Payer: Medicaid Other | Admitting: Family Medicine

## 2014-10-05 ENCOUNTER — Encounter: Payer: Self-pay | Admitting: Family Medicine

## 2014-10-05 VITALS — BP 109/64 | HR 81 | Temp 97.8°F | Ht 65.5 in | Wt 182.0 lb

## 2014-10-05 DIAGNOSIS — N390 Urinary tract infection, site not specified: Secondary | ICD-10-CM | POA: Diagnosis not present

## 2014-10-05 DIAGNOSIS — R8290 Unspecified abnormal findings in urine: Secondary | ICD-10-CM

## 2014-10-05 DIAGNOSIS — F411 Generalized anxiety disorder: Secondary | ICD-10-CM | POA: Diagnosis not present

## 2014-10-05 LAB — POCT UA - MICROSCOPIC ONLY
Casts, Ur, LPF, POC: NEGATIVE
Crystals, Ur, HPF, POC: NEGATIVE
MUCUS UA: NEGATIVE
RBC, urine, microscopic: NEGATIVE
Yeast, UA: NEGATIVE

## 2014-10-05 LAB — POCT URINALYSIS DIPSTICK
BILIRUBIN UA: NEGATIVE
Blood, UA: NEGATIVE
Glucose, UA: NEGATIVE
KETONES UA: NEGATIVE
LEUKOCYTES UA: NEGATIVE
NITRITE UA: NEGATIVE
Protein, UA: NEGATIVE
Spec Grav, UA: 1.015
Urobilinogen, UA: NEGATIVE
pH, UA: 6.5

## 2014-10-05 MED ORDER — ALPRAZOLAM 1 MG PO TABS: 1.0000 mg | ORAL_TABLET | Freq: Two times a day (BID) | ORAL | Status: DC | PRN

## 2014-10-05 NOTE — Progress Notes (Signed)
   Subjective:    Patient ID: Jaime Vaughn, female    DOB: 1980-07-06, 34 y.o.   MRN: [id number]  HPI34 yearr-old female with frequent urinary tract infections. She has been seen by urology who apparently did not really work this up. She cannot relate these infections to sexual activity. She says there may be some history of vaginitis related to infections but it's not really clear. She has had at least one case of pyelonephritis but for the most part they've been bladder infections.  Patient Active Problem List   Diagnosis Date Noted  . Opioid dependence 12/04/2010   Outpatient Encounter Prescriptions as of 10/05/2014  Medication Sig  . [DISCONTINUED] acetaminophen (TYLENOL) 500 MG tablet Take 1,000 mg by mouth 2 (two) times daily as needed for headache.  . [DISCONTINUED] ALPRAZolam (XANAX) 0.5 MG tablet Take 1 tablet (0.5 mg total) by mouth 2 (two) times daily as needed for anxiety.  . [DISCONTINUED] ciprofloxacin (CIPRO) 500 MG tablet Take 1 tablet (500 mg total) by mouth 2 (two) times daily.  . [DISCONTINUED] nitrofurantoin, macrocrystal-monohydrate, (MACROBID) 100 MG capsule Take 1 capsule (100 mg total) by mouth 2 (two) times daily.  . [DISCONTINUED] Norethindrone-Ethinyl Estradiol-Fe Biphas (LO LOESTRIN FE) 1 MG-10 MCG / 10 MCG tablet Take 1 tablet by mouth every evening.   . [DISCONTINUED] sulfamethoxazole-trimethoprim (BACTRIM DS,SEPTRA DS) 800-160 MG per tablet Take 1 tablet by mouth at bedtime.   No facility-administered encounter medications on file as of 10/05/2014.      Review of Systems  Constitutional: Negative.   Respiratory: Negative.   Cardiovascular: Negative.   Genitourinary: Positive for dysuria. Negative for frequency.  Psychiatric/Behavioral: The patient is nervous/anxious.        Objective:   Physical Exam  Constitutional: She is oriented to person, place, and time. She appears well-developed and well-nourished.  Cardiovascular: Normal rate and regular  rhythm.   Pulmonary/Chest: Effort normal and breath sounds normal.  Abdominal: Soft.  Neurological: She is alert and oriented to person, place, and time.     BP 109/64 mmHg  Pulse 81  Temp(Src) 97.8 F (36.6 C) (Oral)  Ht 5' 5.5" (1.664 m)  Wt 182 lb (82.555 kg)  BMI 29.82 kg/m2      Assessment & Plan:  1. Abnormal urine odor Urinalysis is not really look that bad today but I will do culture. There are no he store excessive bacteria in the urine - POCT UA - Microscopic Only - POCT urinalysis dipstick - Urine culture  2. Frequent UTI Will first try to find if there are any anatomic issues which might contribute to infections. If there are none consider longer-term use of trimethoprim or Macrodantin - CT Abdomen Pelvis W Contrast  3.anxiety Refill Xanax 1 mg twice a day as needed. We had a discussion about taking medicine only as needed and if it appeared that she is taking more than I am comfortable will have to stop  Frederica Kuster MD

## 2014-10-05 NOTE — Patient Instructions (Signed)

## 2014-10-08 LAB — URINE CULTURE

## 2014-10-11 ENCOUNTER — Other Ambulatory Visit: Payer: Self-pay | Admitting: *Deleted

## 2014-10-11 MED ORDER — NITROFURANTOIN MONOHYD MACRO 100 MG PO CAPS: 100.0000 mg | ORAL_CAPSULE | Freq: Two times a day (BID) | ORAL | Status: DC

## 2014-10-12 ENCOUNTER — Telehealth: Payer: Self-pay | Admitting: Family Medicine

## 2014-10-12 ENCOUNTER — Ambulatory Visit (HOSPITAL_COMMUNITY): Payer: Medicaid Other

## 2014-10-12 NOTE — Telephone Encounter (Signed)
Labs printed and in the drawer to pick up.

## 2014-10-13 ENCOUNTER — Ambulatory Visit (HOSPITAL_COMMUNITY): Payer: Medicaid Other

## 2014-10-14 ENCOUNTER — Ambulatory Visit (HOSPITAL_COMMUNITY): Payer: Medicaid Other

## 2014-10-24 ENCOUNTER — Ambulatory Visit (HOSPITAL_COMMUNITY): Payer: Medicaid Other

## 2014-10-28 ENCOUNTER — Ambulatory Visit (HOSPITAL_COMMUNITY): Payer: Medicaid Other

## 2014-11-03 ENCOUNTER — Other Ambulatory Visit: Payer: Medicaid Other

## 2014-11-03 ENCOUNTER — Telehealth: Payer: Self-pay | Admitting: *Deleted

## 2014-11-03 DIAGNOSIS — N390 Urinary tract infection, site not specified: Secondary | ICD-10-CM

## 2014-11-03 MED ORDER — ALPRAZOLAM 1 MG PO TABS: 1.0000 mg | ORAL_TABLET | Freq: Two times a day (BID) | ORAL | Status: DC | PRN

## 2014-11-03 NOTE — Telephone Encounter (Signed)
Treated for UTI last month.  Symptoms never completely resolved after taking antibiotics. Order placed for urine specimen repeat. Dr Hyacinth MeekerMiller is out of the office so [facility].  CT abd/pelvis was ordered but doesn't appear that this has been done.  Patient will need to follow up with Dr Hyacinth MeekerMiller if symptoms persist or recurrent UTIs continue.

## 2014-11-03 NOTE — Telephone Encounter (Signed)
May refill enough Xanax until Dr. Hyacinth MeekerMiller returns The patient should be scheduled with the urologist because of the recurrent urinary tract infection

## 2014-11-03 NOTE — Telephone Encounter (Signed)
Patient is also requesting refill on Xanax. Last given on 10/05/14. If approved route to nurse to call in to [facility].  Dr Hyacinth MeekerMiller is out of the office so request sent to Dr Christell ConstantMoore.

## 2014-11-03 NOTE — Telephone Encounter (Signed)
Xanax #14 called into pharmacy. Patient scheduled with Dr Hyacinth MeekerMiller next week for f/u. Transferred to referral dept to reschedule CT scan. She had to cancel it before because it conflicted with her dad's oncology appt.

## 2014-11-07 ENCOUNTER — Ambulatory Visit (HOSPITAL_COMMUNITY): Payer: Medicaid Other

## 2014-11-09 ENCOUNTER — Other Ambulatory Visit: Payer: Self-pay | Admitting: *Deleted

## 2014-11-09 ENCOUNTER — Ambulatory Visit: Payer: Medicaid Other | Admitting: Family Medicine

## 2014-11-10 ENCOUNTER — Ambulatory Visit: Payer: Medicaid Other | Admitting: Family Medicine

## 2014-11-11 ENCOUNTER — Ambulatory Visit: Payer: Medicaid Other | Admitting: Family Medicine

## 2014-11-11 ENCOUNTER — Other Ambulatory Visit: Payer: Self-pay | Admitting: *Deleted

## 2014-11-11 MED ORDER — ALPRAZOLAM 1 MG PO TABS: 1.0000 mg | ORAL_TABLET | Freq: Two times a day (BID) | ORAL | Status: DC | PRN

## 2014-11-11 NOTE — Telephone Encounter (Signed)
Patient had to cancel her appt with Dr Hyacinth MeekerMiller this week because of several issues in her personal life.  She was rescheduled for Tues 11/15/14. She was given enough Xanax at her last refill to last until her appt this week.  Since this appt has been moved to next week can you authorize a refill of her Xanax to last until then? She will need for days worth of medicine.  She is aware that she will have to keep the appt on Tuesdasy and will not be able to have any additional refills after this one.

## 2014-11-11 NOTE — Telephone Encounter (Signed)
Med called into pharmacy. Patient is aware that she will have to keep her appt on 11/15/14.

## 2014-11-11 NOTE — Telephone Encounter (Signed)
I do not normally prescribe this high of a dose of Xanax but you may refill it until her appointment with Dr. Hyacinth MeekerMiller occurs

## 2014-11-15 ENCOUNTER — Other Ambulatory Visit: Payer: Self-pay | Admitting: *Deleted

## 2014-11-15 ENCOUNTER — Ambulatory Visit (INDEPENDENT_AMBULATORY_CARE_PROVIDER_SITE_OTHER): Payer: Medicaid Other | Admitting: Family Medicine

## 2014-11-15 ENCOUNTER — Encounter: Payer: Self-pay | Admitting: Family Medicine

## 2014-11-15 VITALS — BP 101/61 | HR 77 | Temp 96.8°F | Ht 65.5 in | Wt 183.6 lb

## 2014-11-15 DIAGNOSIS — N390 Urinary tract infection, site not specified: Secondary | ICD-10-CM | POA: Diagnosis not present

## 2014-11-15 LAB — POCT URINALYSIS DIPSTICK
Ketones, UA: NEGATIVE
LEUKOCYTES UA: NEGATIVE
NITRITE UA: POSITIVE
Protein, UA: NEGATIVE
UROBILINOGEN UA: NEGATIVE
pH, UA: 6

## 2014-11-15 LAB — POCT UA - MICROSCOPIC ONLY
Casts, Ur, LPF, POC: NEGATIVE
Crystals, Ur, HPF, POC: NEGATIVE
EPITHELIAL CELLS, URINE PER MICROSCOPY: NEGATIVE
Mucus, UA: NEGATIVE
RBC, urine, microscopic: NEGATIVE
WBC, UR, HPF, POC: NEGATIVE
YEAST UA: NEGATIVE

## 2014-11-15 MED ORDER — ALPRAZOLAM 1 MG PO TABS: 1.0000 mg | ORAL_TABLET | Freq: Two times a day (BID) | ORAL | Status: DC | PRN

## 2014-11-15 MED ORDER — NITROFURANTOIN MONOHYD MACRO 100 MG PO CAPS: 100.0000 mg | ORAL_CAPSULE | ORAL | Status: DC

## 2014-11-15 NOTE — Progress Notes (Signed)
   Subjective:    Patient ID: Jaime Vaughn, female    DOB: [birth year], 34 y.o.   MRN: [id number]  HPI 34 year old female with kidney pain and malodorous urine. At her last visit, urine culture was done showing Escherichia coli and she was treated with an antibiotic appropriate to sensitivities, Macrobid. Symptoms improved. She was also scheduled for a abdominal CT to look at her kidneys to see if there was any anatomic reason why she should be having UTIs. This has not been done yet.  Patient Active Problem List   Diagnosis Date Noted  . Generalized anxiety disorder 10/05/2014  . Opioid dependence (HCC) 12/04/2010   Outpatient Encounter Prescriptions as of 11/15/2014  Medication Sig  . ALPRAZolam (XANAX) 1 MG tablet Take 1 tablet (1 mg total) by mouth 2 (two) times daily as needed for anxiety.  . [DISCONTINUED] nitrofurantoin, macrocrystal-monohydrate, (MACROBID) 100 MG capsule Take 1 capsule (100 mg total) by mouth 2 (two) times daily.   No facility-administered encounter medications on file as of 11/15/2014.      Review of Systems  Respiratory: Negative.   Cardiovascular: Negative.   Gastrointestinal: Negative.   Genitourinary: Positive for dysuria.       Objective:   Physical Exam  Constitutional: She is oriented to person, place, and time. She appears well-developed and well-nourished.  Cardiovascular: Normal rate and regular rhythm.   Pulmonary/Chest: Effort normal and breath sounds normal.  Abdominal:  Bilateral CVAT to percussion  Neurological: She is alert and oriented to person, place, and time.          Assessment & Plan:  1. Urinary tract infection, site not specified Urine still has bad odor and still has bacteria. We'll plan on culturing again but based on last culture and sensitivity restart Macrobid 100 mg once a day and will plan to use for 2-4 weeks. Hopefully we can get the CT scan done to define anatomy and go from there. - POCT urinalysis dipstick -  POCT UA - Microscopic Only  Frederica KusterStephen M Kacper Cartlidge MD - Urine culture

## 2014-11-17 LAB — URINE CULTURE

## 2014-11-19 ENCOUNTER — Telehealth: Payer: Self-pay | Admitting: Family Medicine

## 2014-11-21 ENCOUNTER — Ambulatory Visit (HOSPITAL_COMMUNITY): Payer: Medicaid Other

## 2014-11-21 ENCOUNTER — Telehealth: Payer: Self-pay | Admitting: Family Medicine

## 2014-11-21 NOTE — Telephone Encounter (Signed)
Patient aware of results.

## 2014-12-02 ENCOUNTER — Other Ambulatory Visit: Payer: Self-pay | Admitting: *Deleted

## 2014-12-02 ENCOUNTER — Telehealth: Payer: Self-pay | Admitting: Family Medicine

## 2014-12-02 MED ORDER — NITROFURANTOIN MONOHYD MACRO 100 MG PO CAPS
100.0000 mg | ORAL_CAPSULE | Freq: Every day | ORAL | Status: DC
Start: 2014-12-02 — End: 2015-02-16

## 2014-12-02 NOTE — Telephone Encounter (Signed)
Looks like you ordered this for Hyacinth MeekerMiller, the directions are messed up, so i don't know if she needs or not? Had a refill? [place] is covering for him

## 2014-12-02 NOTE — Telephone Encounter (Signed)
Per Dr. Rondel BatonMiller's note, Macrobid is to be 100 mg daily for 2-4 weeks, this is the reason for #15 with a refill. Removed the BID directions and resent.

## 2014-12-13 ENCOUNTER — Other Ambulatory Visit: Payer: Self-pay | Admitting: Family Medicine

## 2014-12-13 MED ORDER — ALPRAZOLAM 1 MG PO TABS: 1.0000 mg | ORAL_TABLET | Freq: Two times a day (BID) | ORAL | Status: DC | PRN

## 2014-12-13 NOTE — Telephone Encounter (Signed)
[facility] 

## 2014-12-13 NOTE — Telephone Encounter (Signed)
Rx refill per patient request 

## 2014-12-15 ENCOUNTER — Other Ambulatory Visit: Payer: Self-pay | Admitting: *Deleted

## 2014-12-22 ENCOUNTER — Ambulatory Visit: Payer: Medicaid Other | Admitting: Family Medicine

## 2014-12-23 ENCOUNTER — Encounter: Payer: Self-pay | Admitting: Family Medicine

## 2014-12-23 ENCOUNTER — Ambulatory Visit: Payer: Medicaid Other | Admitting: Family Medicine

## 2015-01-10 ENCOUNTER — Other Ambulatory Visit: Payer: Self-pay | Admitting: Family Medicine

## 2015-01-10 MED ORDER — ALPRAZOLAM 1 MG PO TABS: 1.0000 mg | ORAL_TABLET | Freq: Two times a day (BID) | ORAL | Status: DC | PRN

## 2015-01-10 NOTE — Telephone Encounter (Signed)
Last filled 12/13/14, last seen 114/01/16. [facility]

## 2015-01-11 ENCOUNTER — Other Ambulatory Visit: Payer: Self-pay | Admitting: Family Medicine

## 2015-01-11 NOTE — Telephone Encounter (Signed)
Aware of xanax refill.

## 2015-01-11 NOTE — Telephone Encounter (Signed)
Script [facility]ax [facility]

## 2015-02-08 ENCOUNTER — Telehealth: Payer: Self-pay | Admitting: Family Medicine

## 2015-02-08 MED ORDER — ALPRAZOLAM 1 MG PO TABS: 1.0000 mg | ORAL_TABLET | Freq: Two times a day (BID) | ORAL | Status: DC | PRN

## 2015-02-08 NOTE — Telephone Encounter (Signed)
TC to pt, verbal order ok'd to [facility] [place] [place] till pt is seen next week.

## 2015-02-09 ENCOUNTER — Ambulatory Visit: Payer: Medicaid Other | Admitting: Family Medicine

## 2015-02-10 ENCOUNTER — Encounter: Payer: Self-pay | Admitting: Family Medicine

## 2015-02-16 ENCOUNTER — Ambulatory Visit: Payer: Medicaid Other | Admitting: Family Medicine

## 2015-02-16 ENCOUNTER — Encounter: Payer: Self-pay | Admitting: Family Medicine

## 2015-02-16 ENCOUNTER — Ambulatory Visit (INDEPENDENT_AMBULATORY_CARE_PROVIDER_SITE_OTHER): Payer: Medicaid Other | Admitting: Family Medicine

## 2015-02-16 VITALS — BP 113/74 | HR 82 | Temp 97.1°F | Ht 65.5 in | Wt 175.0 lb

## 2015-02-16 DIAGNOSIS — F411 Generalized anxiety disorder: Secondary | ICD-10-CM

## 2015-02-16 DIAGNOSIS — N898 Other specified noninflammatory disorders of vagina: Secondary | ICD-10-CM

## 2015-02-16 DIAGNOSIS — N9489 Other specified conditions associated with female genital organs and menstrual cycle: Secondary | ICD-10-CM | POA: Diagnosis not present

## 2015-02-16 LAB — POCT URINALYSIS DIPSTICK
Bilirubin, UA: NEGATIVE
GLUCOSE UA: NEGATIVE
KETONES UA: NEGATIVE
NITRITE UA: NEGATIVE
PH UA: 5
Spec Grav, UA: >=1.03
Urobilinogen, UA: NEGATIVE

## 2015-02-16 LAB — POCT WET PREP (WET MOUNT): KOH Wet Prep POC: POSITIVE

## 2015-02-16 LAB — POCT UA - MICROSCOPIC ONLY
Casts, Ur, LPF, POC: NEGATIVE
Crystals, Ur, HPF, POC: NEGATIVE
MUCUS UA: NEGATIVE
Yeast, UA: NEGATIVE

## 2015-02-16 MED ORDER — NITROFURANTOIN MONOHYD MACRO 100 MG PO CAPS: 100.0000 mg | ORAL_CAPSULE | Freq: Every day | ORAL | Status: DC

## 2015-02-16 MED ORDER — METRONIDAZOLE 500 MG PO TABS
500.0000 mg | ORAL_TABLET | Freq: Two times a day (BID) | ORAL | Status: DC
Start: 2015-02-16 — End: 2015-04-27

## 2015-02-16 MED ORDER — ALPRAZOLAM 1 MG PO TABS: 1.0000 mg | ORAL_TABLET | Freq: Two times a day (BID) | ORAL | Status: DC | PRN

## 2015-02-16 NOTE — Progress Notes (Signed)
   Subjective:    Patient ID: Jaime Vaughn, female    DOB: 20-May-1980, 35 y.o.   MRN: [id number]  HPI Patient here today for refill on meds, vaginal discharge and tooth pain.  Has frequent urinary symptoms most recent episode was culture showing Escherichia coli. This time she also has a vaginal discharge with a mild odor. One of her predominant symptoms as flank pain and we had wanted to look at her urinary tract previously to see if there was any problem with the anatomy. That scan never happened but we will try to reschedule. She also had a tooth pulled is having some pain. She takes Suboxone chronically however.     Patient Active Problem List   Diagnosis Date Noted  . Generalized anxiety disorder 10/05/2014  . Opioid dependence (HCC) 12/04/2010   Outpatient Encounter Prescriptions as of 02/16/2015  Medication Sig  . ALPRAZolam (XANAX) 1 MG tablet Take 1 tablet (1 mg total) by mouth 2 (two) times daily as needed for anxiety.  . SUBOXONE 8-2 MG FILM Place 1.5 application under the tongue daily.  . [DISCONTINUED] nitrofurantoin, macrocrystal-monohydrate, (MACROBID) 100 MG capsule Take 1 capsule (100 mg total) by mouth daily.   No facility-administered encounter medications on file as of 02/16/2015.     Review of Systems  Constitutional: Negative.   HENT: Negative.   Eyes: Negative.   Respiratory: Negative.   Cardiovascular: Negative.   Gastrointestinal: Negative.   Endocrine: Negative.   Genitourinary: Positive for vaginal discharge (WITH ODOR).  Musculoskeletal: Negative.   Skin: Negative.   Allergic/Immunologic: Negative.   Neurological: Negative.   Hematological: Negative.   Psychiatric/Behavioral: Negative.        Objective:   Physical Exam  Constitutional: She is oriented to person, place, and time. She appears well-developed and well-nourished.  Cardiovascular: Normal rate and regular rhythm.   Pulmonary/Chest: Effort normal and breath sounds normal.  Abdominal:  Soft.  Neurological: She is alert and oriented to person, place, and time.   BP 113/74 mmHg  Pulse 82  Temp(Src) 97.1 F (36.2 C) (Oral)  Ht 5' 5.5" (1.664 m)  Wt 175 lb (79.379 kg)  BMI 28.67 kg/m2  LMP 02/12/2015        Assessment & Plan:  1. Vaginal discharge Shows clue cells consistent with bacterial vaginosis. Plan to treat with metronidazole 500 mg twice a day for 1 week. Will do urine culture and probably plan on restarting nitrofurantoin as a prophylactic urinary antimicrobial - POCT UA - Microscopic Only - POCT urinalysis dipstick - Urine culture - POCT Wet Prep [facility])  2. Vaginal odor A Renae Gloss has BV - POCT UA - Microscopic Only - POCT urinalysis dipstick - Urine culture - POCT Wet Prep ([place])    3. Generalized anxiety disorder Refill previous prescription for Xanax  Frederica Kuster MD

## 2015-02-19 LAB — URINE CULTURE

## 2015-03-10 ENCOUNTER — Telehealth: Payer: Self-pay | Admitting: Family Medicine

## 2015-03-14 ENCOUNTER — Other Ambulatory Visit: Payer: Self-pay | Admitting: Family Medicine

## 2015-03-14 MED ORDER — ALPRAZOLAM 1 MG PO TABS: 1.0000 mg | ORAL_TABLET | Freq: Two times a day (BID) | ORAL | Status: DC | PRN

## 2015-03-14 NOTE — Telephone Encounter (Signed)
rx phoned into pharmacy and pt is aware.

## 2015-03-14 NOTE — Telephone Encounter (Signed)
Xanax,  Quantity 14, 0 refills,    called to pharmacy voice mail.

## 2015-03-14 NOTE — Telephone Encounter (Signed)
Seen and filled for #14 on 02/16/15.

## 2015-03-15 ENCOUNTER — Telehealth: Payer: Self-pay | Admitting: Family Medicine

## 2015-03-15 NOTE — Telephone Encounter (Signed)
Patient aware you are out of the office today. Please route to Pool A as I am leaving early today and will not be here tomorrow.

## 2015-03-16 MED ORDER — ALPRAZOLAM 1 MG PO TABS: 1.0000 mg | ORAL_TABLET | Freq: Two times a day (BID) | ORAL | Status: DC | PRN

## 2015-04-19 ENCOUNTER — Telehealth: Payer: Self-pay | Admitting: Family Medicine

## 2015-04-19 ENCOUNTER — Other Ambulatory Visit: Payer: Self-pay

## 2015-04-19 MED ORDER — ALPRAZOLAM 1 MG PO TABS: 1.0000 mg | ORAL_TABLET | Freq: Two times a day (BID) | ORAL | Status: DC | PRN

## 2015-04-19 NOTE — Telephone Encounter (Signed)
Patient was seen in the hospital on 04/13/15 and was given cephalexin for UTI.  Patient states that she has had diarrhea on the antibiotic and would like it switched to something different.  Appt to see Dr. Hyacinth MeekerMiller on 04/27/15

## 2015-04-19 NOTE — Telephone Encounter (Signed)
Last seen 02/16/15  Dr Hyacinth MeekerMiller  If approved route [facility]

## 2015-04-20 NOTE — Telephone Encounter (Signed)
Done 04/19/15

## 2015-04-27 ENCOUNTER — Encounter (INDEPENDENT_AMBULATORY_CARE_PROVIDER_SITE_OTHER): Payer: Self-pay

## 2015-04-27 ENCOUNTER — Ambulatory Visit (INDEPENDENT_AMBULATORY_CARE_PROVIDER_SITE_OTHER): Payer: Medicaid Other | Admitting: Family Medicine

## 2015-04-27 ENCOUNTER — Encounter: Payer: Self-pay | Admitting: Family Medicine

## 2015-04-27 VITALS — BP 87/59 | HR 76 | Temp 97.3°F | Ht 65.5 in | Wt 182.4 lb

## 2015-04-27 DIAGNOSIS — R0789 Other chest pain: Secondary | ICD-10-CM | POA: Diagnosis not present

## 2015-04-27 MED ORDER — HYDROCODONE-ACETAMINOPHEN 5-300 MG PO TABS: 1.0000 | ORAL_TABLET | Freq: Every day | ORAL | Status: DC

## 2015-04-27 MED ORDER — NITROFURANTOIN MONOHYD MACRO 100 MG PO CAPS: ORAL_CAPSULE | ORAL | Status: DC

## 2015-04-27 MED ORDER — MELOXICAM 15 MG PO TABS: 15.0000 mg | ORAL_TABLET | Freq: Every day | ORAL | Status: DC
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
## 2015-05-14 ENCOUNTER — Other Ambulatory Visit: Payer: Self-pay | Admitting: Family Medicine

## 2015-05-16 ENCOUNTER — Other Ambulatory Visit: Payer: Self-pay | Admitting: Family Medicine

## 2015-05-16 MED ORDER — ALPRAZOLAM 1 MG PO TABS: 1.0000 mg | ORAL_TABLET | Freq: Two times a day (BID) | ORAL | Status: DC | PRN

## 2015-05-16 NOTE — Telephone Encounter (Signed)
Last seen 04/27/15  Dr Hyacinth Meekermiller  If approved route to [facility]

## 2015-05-16 NOTE — Telephone Encounter (Signed)
Last filled 04/19/15, last seen 02/16/15. [facility]

## 2015-05-17 NOTE — Telephone Encounter (Signed)
Script for xanax [facility]

## 2015-06-12 ENCOUNTER — Other Ambulatory Visit: Payer: Self-pay | Admitting: Family Medicine

## 2015-06-13 ENCOUNTER — Other Ambulatory Visit: Payer: Self-pay | Admitting: Family Medicine

## 2015-06-13 NOTE — Telephone Encounter (Signed)
done

## 2015-06-13 NOTE — Telephone Encounter (Signed)
Has refill at pharmacy

## 2015-06-13 NOTE — Telephone Encounter (Signed)
Has refill called pt

## 2015-07-04 ENCOUNTER — Other Ambulatory Visit: Payer: Self-pay | Admitting: Family Medicine

## 2015-07-04 NOTE — Telephone Encounter (Signed)
It is not good to continually take antibiotics for urinary tract infection that may spread to the kidneys. This patient needs to come in and see a provider.

## 2015-07-07 ENCOUNTER — Ambulatory Visit: Payer: Medicaid Other | Admitting: Family Medicine

## 2015-07-13 ENCOUNTER — Other Ambulatory Visit: Payer: Self-pay | Admitting: Family Medicine

## 2015-07-13 MED ORDER — ALPRAZOLAM 1 MG PO TABS: 1.0000 mg | ORAL_TABLET | Freq: Two times a day (BID) | ORAL | Status: DC | PRN

## 2015-07-13 NOTE — Telephone Encounter (Signed)
Last filled 06/16/15, last seen 04/27/15 by Hyacinth MeekerMiller. [facility]

## 2015-07-21 ENCOUNTER — Ambulatory Visit: Payer: Medicaid Other | Admitting: Physician Assistant

## 2015-07-26 ENCOUNTER — Ambulatory Visit (INDEPENDENT_AMBULATORY_CARE_PROVIDER_SITE_OTHER): Payer: Medicaid Other | Admitting: Family Medicine

## 2015-07-26 ENCOUNTER — Telehealth: Payer: Self-pay | Admitting: Family Medicine

## 2015-07-26 ENCOUNTER — Encounter: Payer: Self-pay | Admitting: Family Medicine

## 2015-07-26 VITALS — BP 120/72 | HR 93 | Temp 97.1°F | Ht 65.5 in | Wt 169.2 lb

## 2015-07-26 DIAGNOSIS — R300 Dysuria: Secondary | ICD-10-CM

## 2015-07-26 DIAGNOSIS — F411 Generalized anxiety disorder: Secondary | ICD-10-CM | POA: Diagnosis not present

## 2015-07-26 LAB — URINALYSIS, COMPLETE
Bilirubin, UA: NEGATIVE
GLUCOSE, UA: NEGATIVE
Nitrite, UA: POSITIVE — AB
SPEC GRAV UA: 1.025 (ref 1.005–1.030)
Urobilinogen, Ur: 0.2 mg/dL (ref 0.2–1.0)
pH, UA: 6 (ref 5.0–7.5)

## 2015-07-26 LAB — MICROSCOPIC EXAMINATION: Epithelial Cells (non renal): NONE SEEN /hpf (ref 0–10)

## 2015-07-26 MED ORDER — ALPRAZOLAM 1 MG PO TABS: 1.0000 mg | ORAL_TABLET | Freq: Two times a day (BID) | ORAL | Status: DC | PRN

## 2015-07-26 MED ORDER — METRONIDAZOLE 500 MG PO TABS: 500.0000 mg | ORAL_TABLET | Freq: Two times a day (BID) | ORAL | Status: DC

## 2015-07-26 NOTE — Progress Notes (Signed)
   Subjective:    Patient ID: Jaime Vaughn, female    DOB: 1980/09/19, 35 y.o.   MRN: [id number]  HPI  Patient is here today complaining with flank pain, weak and a boil on her right buttocks.  Patient is tearful today. Says she needs Xanax 3 or 4 per day because of all her stress. She also has back pain which is chronic thinks she may have a kidney infection. Does not really have irritative symptoms bladder infection such as frequency and dysuria.  Review of Systems  Constitutional: Positive for fatigue.  HENT: Negative.   Eyes: Negative.   Respiratory: Negative.   Cardiovascular: Negative.   Gastrointestinal: Negative.   Endocrine: Negative.   Genitourinary: Positive for flank pain.          Musculoskeletal: Negative.   Skin: Negative.   Allergic/Immunologic: Negative.   Neurological: Negative.   Hematological: Negative.   Psychiatric/Behavioral: Negative.    [facility] 07/26/2015 02/16/2015 11/15/2014 08/09/2014  Decreased Interest 0 0 0 0  Down, Depressed, Hopeless 0 0 0 0  PHQ - 2 Score 0 0 0 0            Patient Active Problem List   Diagnosis Date Noted  . Generalized anxiety disorder 10/05/2014  . Opioid dependence (HCC) 12/04/2010   Outpatient Encounter Prescriptions as of 07/26/2015  Medication Sig  . ALPRAZolam (XANAX) 1 MG tablet Take 1 tablet (1 mg total) by mouth 2 (two) times daily as needed for anxiety.  . [DISCONTINUED] Hydrocodone-Acetaminophen (VICODIN) 5-300 MG TABS Take 1 tablet by mouth daily.  . [DISCONTINUED] meloxicam (MOBIC) 15 MG tablet TAKE 1 TABLET (15 MG TOTAL) BY MOUTH DAILY.  . [DISCONTINUED] nitrofurantoin, macrocrystal-monohydrate, (MACROBID) 100 MG capsule One tablet twice a day for two weeks then once tablet once a day for two weeks   No facility-administered encounter medications on file as of 07/26/2015.        Objective:   Physical Exam  Constitutional: She appears well-developed and well-nourished.  Skin:    Patient has small pimple on her skin one on the left right and anterior inner thigh and one on the right buttock there is no induration or fluctuance   BP 120/72 mmHg  Pulse 93  Temp(Src) 97.1 F (36.2 C) (Oral)  Ht 5' 5.5" (1.664 m)  Wt 169 lb 3.2 oz (76.749 kg)  BMI 27.72 kg/m2        Assessment & Plan:  1. Dysuria Urinary tract infection although she could have a vaginal infection. She does admit to a discharge with odor As stated above not really Disch area just costovertebral G April tenderness. Will send for CT urogram which we have discussed previously urine specimen does not show cells consistent with - Urinalysis, Complete - Urine culture - CT Abdomen Pelvis W Contrast; Future   2. Generalized anxiety disorder Inclined increasing frequency of alprazolam continue twice a day. Stressed importance of talking to counselor at least monthly rather than increasing dose of alprazolam.  Frederica KusterStephen M Dicie Edelen MD

## 2015-07-26 NOTE — Telephone Encounter (Signed)
Pt aware note is at front desk ready for pickup

## 2015-07-28 LAB — URINE CULTURE

## 2015-08-08 ENCOUNTER — Encounter: Payer: Self-pay | Admitting: *Deleted

## 2015-08-08 ENCOUNTER — Ambulatory Visit (HOSPITAL_COMMUNITY)
Admission: RE | Admit: 2015-08-08 | Discharge: 2015-08-08 | Disposition: A | Discharge status: Home / Self Care | Payer: Medicaid Other | Source: Ambulatory Visit | Attending: Family Medicine | Admitting: Family Medicine

## 2015-08-08 ENCOUNTER — Telehealth: Payer: Self-pay | Admitting: Family Medicine

## 2015-08-08 DIAGNOSIS — Q899 Congenital malformation, unspecified: Secondary | ICD-10-CM | POA: Diagnosis not present

## 2015-08-08 DIAGNOSIS — R300 Dysuria: Secondary | ICD-10-CM | POA: Diagnosis not present

## 2015-08-08 MED ORDER — IOPAMIDOL (ISOVUE-300) INJECTION 61%
100.0000 mL | Freq: Once | INTRAVENOUS | Status: AC | PRN
  Administered 2015-08-08: 100 mL via INTRAVENOUS

## 2015-08-08 MED ORDER — CEFUROXIME AXETIL 250 MG PO TABS: 250.0000 mg | ORAL_TABLET | Freq: Two times a day (BID) | ORAL | 0 refills | Status: DC

## 2015-08-08 NOTE — Addendum Note (Signed)
Addended by: Caryl Bis on: 08/08/2015 11:15 AM   Modules accepted: Orders

## 2015-08-08 NOTE — Telephone Encounter (Signed)
Patient aware that letter is ready for pick up.

## 2015-08-17 ENCOUNTER — Other Ambulatory Visit: Payer: Self-pay | Admitting: *Deleted

## 2015-08-17 DIAGNOSIS — N390 Urinary tract infection, site not specified: Secondary | ICD-10-CM

## 2015-09-07 ENCOUNTER — Telehealth: Payer: Self-pay | Admitting: Family Medicine

## 2015-09-07 NOTE — Telephone Encounter (Signed)
Spoke to pt and advised once we receive the request from the pharmacy we will route it to the provider covering for Dr.Miller. Pt voiced understanding.

## 2015-09-08 ENCOUNTER — Telehealth: Payer: Self-pay | Admitting: Family Medicine

## 2015-09-08 DIAGNOSIS — F411 Generalized anxiety disorder: Secondary | ICD-10-CM

## 2015-09-08 NOTE — Telephone Encounter (Signed)
Pt notified needs follow up appt appt scheduled

## 2015-09-08 NOTE — Telephone Encounter (Signed)
Millers pt. Last seen and filled 07/26/15. Please call her today. She says she called 3 times yesterday

## 2015-09-11 ENCOUNTER — Encounter: Payer: Self-pay | Admitting: Family Medicine

## 2015-09-11 ENCOUNTER — Ambulatory Visit (INDEPENDENT_AMBULATORY_CARE_PROVIDER_SITE_OTHER): Payer: Medicaid Other | Admitting: Family Medicine

## 2015-09-11 VITALS — BP 113/72 | HR 97 | Temp 97.0°F | Ht 65.5 in | Wt 174.4 lb

## 2015-09-11 DIAGNOSIS — F411 Generalized anxiety disorder: Secondary | ICD-10-CM

## 2015-09-11 MED ORDER — ALPRAZOLAM 1 MG PO TABS: 1.0000 mg | ORAL_TABLET | Freq: Two times a day (BID) | ORAL | 0 refills | Status: DC | PRN

## 2015-09-11 NOTE — Patient Instructions (Signed)
Great to see you!   [place] an [place], [phone]   Please make an appointment to come back to see me or Dr. Hyacinth MeekerMiller in 3-4 weeks, before you need a refill of xanax
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
## 2015-09-14 ENCOUNTER — Other Ambulatory Visit: Payer: Self-pay | Admitting: *Deleted

## 2015-09-15 ENCOUNTER — Ambulatory Visit: Payer: Medicaid Other | Admitting: Family Medicine

## 2015-09-19 ENCOUNTER — Ambulatory Visit: Payer: Medicaid Other | Admitting: Urology

## 2015-09-21 ENCOUNTER — Telehealth: Payer: Self-pay | Admitting: *Deleted

## 2015-09-21 NOTE — Telephone Encounter (Signed)
Patient called needing a letter for her probation office that states that she has problems with her bladder that require antibiotics and that she has been referred to a urologist.   Letter written and placed up front. Message left on patient's voicemail that it is ready.

## 2015-09-25 ENCOUNTER — Telehealth (HOSPITAL_COMMUNITY): Payer: Self-pay | Admitting: *Deleted

## 2015-09-25 NOTE — Telephone Encounter (Signed)
left voice message regarding appointment. 

## 2015-09-26 ENCOUNTER — Telehealth (HOSPITAL_COMMUNITY): Payer: Self-pay | Admitting: *Deleted

## 2015-09-26 NOTE — Telephone Encounter (Signed)
left 2nd voice message regarding an appointment. 

## 2015-10-10 ENCOUNTER — Other Ambulatory Visit: Payer: Self-pay | Admitting: Family Medicine

## 2015-10-10 DIAGNOSIS — F411 Generalized anxiety disorder: Secondary | ICD-10-CM

## 2015-10-10 NOTE — Telephone Encounter (Signed)
Closing this encounter, there is a surescript refill on this sent to provider.

## 2015-10-10 NOTE — Telephone Encounter (Signed)
Needs apt to discuss for refill.   Jaime Nashaun Hillmer, MD [facility] 10/10/2015, 2:59 PM

## 2015-10-10 NOTE — Telephone Encounter (Signed)
Appointment made.  Patient would like to know if she can just have enough medication to get her to Thursday.

## 2015-10-11 NOTE — Progress Notes (Signed)
   Subjective:    Patient ID: Jaime Vaughn, female    DOB: Mar 22, 1980, 35 y.o.   MRN: [id number]  HPI patient is here to refill her Xanax, still asking for more. She's taking 2 a day. Given her history of drug dependence with other not increase the dose. She also complains of some back pain. Symptoms that she has chronic urinary tract problems in July she presented with similar symptoms and culture grew out Escherichia coli. She was treated with Ceftin which seem to improve her back pain. She also had negative CT scan of her abdomen and urinary system and is scheduled to see a urologist next month.  Patient Active Problem List   Diagnosis Date Noted  . Dysuria 07/26/2015  . Generalized anxiety disorder 10/05/2014  . Opioid dependence (HCC) 12/04/2010   Outpatient Encounter Prescriptions as of 10/12/2015  Medication Sig  . ALPRAZolam (XANAX) 1 MG tablet Take 1 tablet (1 mg total) by mouth 2 (two) times daily as needed for anxiety.   No facility-administered encounter medications on file as of 10/12/2015.       Review of Systems  HENT: Negative.   Respiratory: Negative.   Cardiovascular: Negative.   Musculoskeletal: Positive for back pain.  Psychiatric/Behavioral: The patient is nervous/anxious.        Objective:   Physical Exam  Constitutional: She appears well-developed and well-nourished.  Cardiovascular: Normal rate.   Pulmonary/Chest: Effort normal.  Musculoskeletal:  Bilateral CVA tenderness  Psychiatric: She has a normal mood and affect. Her behavior is normal. Thought content normal.   BP 108/71   Pulse 77   Temp 98.1 F (36.7 C) (Oral)   Ht 5' 5.5" (1.664 m)   Wt 172 lb (78 kg)   BMI 28.19 kg/m         Assessment & Plan:  1. Low back pain without sciatica, unspecified back pain laterality Urinalysis had 6-10 white cells; begin Ceftin pending result - Urinalysis, Complete - Urine culture  2. Generalized anxiety disorder Symptoms are managed with  alprazolam - ALPRAZolam (XANAX) 1 MG tablet; Take 1 tablet (1 mg total) by mouth 2 (two) times daily as needed for anxiety.  Dispense: 60 tablet; Refill: 2  Frederica KusterStephen M Autrey Human MD

## 2015-10-12 ENCOUNTER — Encounter: Payer: Self-pay | Admitting: Family Medicine

## 2015-10-12 ENCOUNTER — Ambulatory Visit (INDEPENDENT_AMBULATORY_CARE_PROVIDER_SITE_OTHER): Payer: Medicaid Other | Admitting: Family Medicine

## 2015-10-12 VITALS — BP 108/71 | HR 77 | Temp 98.1°F | Ht 65.5 in | Wt 172.0 lb

## 2015-10-12 DIAGNOSIS — F411 Generalized anxiety disorder: Secondary | ICD-10-CM | POA: Diagnosis not present

## 2015-10-12 DIAGNOSIS — M545 Low back pain: Secondary | ICD-10-CM

## 2015-10-12 LAB — MICROSCOPIC EXAMINATION

## 2015-10-12 LAB — URINALYSIS, COMPLETE
Bilirubin, UA: NEGATIVE
GLUCOSE, UA: NEGATIVE
KETONES UA: NEGATIVE
NITRITE UA: NEGATIVE
Protein, UA: NEGATIVE
Specific Gravity, UA: 1.02 (ref 1.005–1.030)
UUROB: 0.2 mg/dL (ref 0.2–1.0)
pH, UA: 7 (ref 5.0–7.5)

## 2015-10-12 MED ORDER — CEFUROXIME AXETIL 250 MG PO TABS: 250.0000 mg | ORAL_TABLET | Freq: Two times a day (BID) | ORAL | 0 refills | Status: DC

## 2015-10-12 MED ORDER — ALPRAZOLAM 1 MG PO TABS: 1.0000 mg | ORAL_TABLET | Freq: Two times a day (BID) | ORAL | 2 refills | Status: DC | PRN

## 2015-10-14 LAB — URINE CULTURE

## 2015-10-17 ENCOUNTER — Ambulatory Visit: Payer: Medicaid Other | Admitting: Family Medicine

## 2015-10-18 ENCOUNTER — Telehealth: Payer: Self-pay | Admitting: *Deleted

## 2015-10-18 NOTE — Telephone Encounter (Signed)
-----   Message from Frederica KusterStephen M Miller, MD sent at 10/17/2015  7:37 AM EDT ----- Mixed colonies is a negative urine culture

## 2015-10-18 NOTE — Telephone Encounter (Signed)
Pt notified of results Verbalizes understanding 

## 2016-01-05 ENCOUNTER — Other Ambulatory Visit: Payer: Self-pay | Admitting: Family Medicine

## 2016-01-05 DIAGNOSIS — F411 Generalized anxiety disorder: Secondary | ICD-10-CM

## 2016-01-05 MED ORDER — ALPRAZOLAM 1 MG PO TABS: 1.0000 mg | ORAL_TABLET | Freq: Two times a day (BID) | ORAL | 0 refills | Status: DC | PRN

## 2016-01-05 NOTE — Telephone Encounter (Signed)
Patient advised of recommendation and rx called into pharmacy.

## 2016-01-05 NOTE — Telephone Encounter (Signed)
Last filled 12/12/15, last seen 10/12/15. [facility]

## 2016-01-05 NOTE — Telephone Encounter (Signed)
Will refill prescription this time same dosage and same instructions but we will need to gradually reduce the dose because this is the last time it can be refill given the information with drug usage, cocaine.There are several ways to reduce the dose I would recommend taking one half tablet twice a day for 1 week then one half tablet daily for 1 week then stop

## 2016-01-09 NOTE — Progress Notes (Signed)
   Subjective:    Patient ID: Jaime JacobsonAprel D Sovine, female    DOB: [birth year], 35 y.o.   MRN: [id number]  HPI patient is here to discuss possible withdrawal of her Xanax. Last week I learned of a probation violation in which she tested positive for cocaine. She says this was due to the application(topically) of a local over-the-counter anesthetic. She was very tearful saying that she did not use cocaine and that somebody was trying to "frame her." Heretofore, she has taken the alprazolam as prescribed. She is on Suboxone and has a history of opiate abuse. I decided to give her 1 more chance telling her that she was on thin ice and that we would have to do drug screens monthly for several months in order to refill her prescription. Since the visit I have spoken with several other providers in the office including the lab clinical pharmacologist and have researched the topic on up to date. I can find no evidence that anesthetic which show in the urine as a metabolite of cocaine. When she returns for drug screen next month will confront her with these facts and plan to discontinue further prescribing of alprazolam.  Patient Active Problem List   Diagnosis Date Noted  . Dysuria 07/26/2015  . Generalized anxiety disorder 10/05/2014  . Opioid dependence (HCC) 12/04/2010   Outpatient Encounter Prescriptions as of 01/10/2016  Medication Sig  . ALPRAZolam (XANAX) 1 MG tablet Take 1 tablet (1 mg total) by mouth 2 (two) times daily as needed for anxiety.  . [DISCONTINUED] cefUROXime (CEFTIN) 250 MG tablet Take 1 tablet (250 mg total) by mouth 2 (two) times daily with a meal.   No facility-administered encounter medications on file as of 01/10/2016.       Review of Systems  Psychiatric/Behavioral: The patient is nervous/anxious.        Objective:   Physical Exam  Constitutional: She is oriented to person, place, and time. She appears well-developed and well-nourished.  Neurological: She is alert and  oriented to person, place, and time.  Psychiatric: She has a normal mood and affect.   BP 100/68   Pulse 88   Temp 99.9 F (37.7 C) (Oral)   Ht 5' 5.5" (1.664 m)   Wt 176 lb 12.8 oz (80.2 kg)   BMI 28.97 kg/m         Assessment & Plan:  1. Generalized anxiety disorder He filled alprazolam 1 mg twice a day #60. Patient is to return in one month for drug screen and refill at which time we will further discuss obtaining medicines at this practice.  Frederica KusterStephen M Miller MD

## 2016-01-10 ENCOUNTER — Ambulatory Visit (INDEPENDENT_AMBULATORY_CARE_PROVIDER_SITE_OTHER): Payer: Medicaid Other | Admitting: Family Medicine

## 2016-01-10 ENCOUNTER — Encounter: Payer: Self-pay | Admitting: Family Medicine

## 2016-01-10 DIAGNOSIS — F411 Generalized anxiety disorder: Secondary | ICD-10-CM | POA: Diagnosis not present

## 2016-01-10 MED ORDER — ALPRAZOLAM 1 MG PO TABS: 1.0000 mg | ORAL_TABLET | Freq: Two times a day (BID) | ORAL | 0 refills | Status: DC | PRN

## 2016-01-11 ENCOUNTER — Telehealth: Payer: Self-pay | Admitting: Family Medicine

## 2016-01-11 NOTE — Telephone Encounter (Signed)
Per pharmacy patient will need to wait until 1/9 to have xanax filled due to getting a 15 day supply of Xanax 12/23. Patient aware and verbalizes understanding.

## 2016-01-18 ENCOUNTER — Ambulatory Visit: Payer: Medicaid Other | Admitting: Family Medicine

## 2016-01-23 ENCOUNTER — Telehealth: Payer: Self-pay | Admitting: Family Medicine

## 2016-01-23 NOTE — Telephone Encounter (Signed)
Per previous note patient may pick up alprazolam Rx today 01/23/2016

## 2016-02-15 ENCOUNTER — Ambulatory Visit: Payer: Medicaid Other | Admitting: Family Medicine

## 2016-02-20 ENCOUNTER — Ambulatory Visit: Payer: Medicaid Other | Admitting: Family Medicine

## 2016-02-21 ENCOUNTER — Ambulatory Visit: Payer: Medicaid Other | Admitting: Family Medicine

## 2016-03-06 ENCOUNTER — Ambulatory Visit: Payer: Medicaid Other | Admitting: Family Medicine

## 2016-03-06 ENCOUNTER — Encounter: Payer: Self-pay | Admitting: Family Medicine

## 2016-03-06 ENCOUNTER — Ambulatory Visit (INDEPENDENT_AMBULATORY_CARE_PROVIDER_SITE_OTHER): Payer: Medicaid Other | Admitting: Family Medicine

## 2016-03-06 VITALS — BP 120/79 | HR 78 | Temp 98.6°F | Ht 65.5 in | Wt 181.0 lb

## 2016-03-06 DIAGNOSIS — S00502A Unspecified superficial injury of oral cavity, initial encounter: Secondary | ICD-10-CM | POA: Diagnosis not present

## 2016-03-06 DIAGNOSIS — Z79899 Other long term (current) drug therapy: Secondary | ICD-10-CM | POA: Diagnosis not present

## 2016-03-06 DIAGNOSIS — K0510 Chronic gingivitis, plaque induced: Secondary | ICD-10-CM

## 2016-03-06 DIAGNOSIS — S00502D Unspecified superficial injury of oral cavity, subsequent encounter: Secondary | ICD-10-CM

## 2016-03-06 DIAGNOSIS — L089 Local infection of the skin and subcutaneous tissue, unspecified: Secondary | ICD-10-CM | POA: Diagnosis not present

## 2016-03-06 MED ORDER — AMOXICILLIN 500 MG PO CAPS: 500.0000 mg | ORAL_CAPSULE | Freq: Three times a day (TID) | ORAL | 0 refills | Status: DC

## 2016-03-06 MED ORDER — ALPRAZOLAM 1 MG PO TABS: 1.0000 mg | ORAL_TABLET | Freq: Two times a day (BID) | ORAL | 0 refills | Status: AC | PRN

## 2016-03-06 NOTE — Progress Notes (Signed)
   Subjective:    Patient ID: Jaime JacobsonAprel D Vaughn, female    DOB: August 31, 1980, 36 y.o.   MRN: [id number]  HPI Patient here today for follow up on medication. I confronted patient about the cocaine metabolite in her urine drug screen. She still says that she does not use cocaine. I told her we would have to do urine checks monthly for a while. She complains of some pain and a tooth socket where she had a tooth extracted by oral surgeon. She is not asking for pain medicine but would like to try an antibiotic.  She tells me her children's grandmother is trying to gain custody of her children and submitting false claims to Department of Social Services etc. about nefarious activities at her place of residence.  Patient Active Problem List   Diagnosis Date Noted  . Dysuria 07/26/2015  . Generalized anxiety disorder 10/05/2014  . Opioid dependence (HCC) 12/04/2010   Outpatient Encounter Prescriptions as of 03/06/2016  Medication Sig  . ALPRAZolam (XANAX) 1 MG tablet Take 1 tablet (1 mg total) by mouth 2 (two) times daily as needed for anxiety.   No facility-administered encounter medications on file as of 03/06/2016.       Review of Systems  Constitutional: Negative.   HENT: Negative.        Tooth pulled Monday 02/26/16.  Eyes: Negative.   Respiratory: Negative.   Cardiovascular: Negative.   Gastrointestinal: Negative.   Endocrine: Negative.   Genitourinary: Negative.   Musculoskeletal: Negative.   Skin: Negative.   Allergic/Immunologic: Negative.   Neurological: Negative.   Hematological: Negative.        Objective:   Physical Exam  Constitutional: She is oriented to person, place, and time. She appears well-developed and well-nourished.  HENT:  Left lower third molar socket. There is some discolored material in the base of the socket and inflammation around the gum. This may indicate some infection but I'm not sure.  Neurological: She is alert and oriented to person, place, and time.   Psychiatric: She has a normal mood and affect. Her behavior is normal. Judgment and thought content normal.   BP 120/79 (BP Location: Left Arm)   Pulse 78   Temp 98.6 F (37 C) (Oral)   Ht 5' 5.5" (1.664 m)   Wt 181 lb (82.1 kg)   LMP 02/28/2016   BMI 29.66 kg/m         Assessment & Plan:  1. Medication management Refilled Xanax same dose and directions as before patient has said she will try to get appointment with mental health for refill - [employer] Select 13 (MW), Urine  2. Superficial injury of gingiva with infection, subsequent encounter Treat with amoxicillin 500 mg 3 times a day for 1 week may also try warm salt water gargles  Frederica KusterStephen M Miller MD

## 2016-03-12 LAB — TOXASSURE SELECT 13 (MW), URINE

## 2016-03-14 ENCOUNTER — Ambulatory Visit: Payer: Medicaid Other | Admitting: Family Medicine

## 2016-04-01 ENCOUNTER — Telehealth: Payer: Self-pay | Admitting: Family Medicine

## 2016-04-02 NOTE — Progress Notes (Deleted)
   Subjective:    Patient ID: Jaime JacobsonAprel D Vaughn, female    DOB: [birth year], 36 y.o.   MRN: [id number]  HPI    Review of Systems     Objective:   Physical Exam        Assessment & Plan:

## 2016-04-03 ENCOUNTER — Ambulatory Visit: Payer: Medicaid Other | Admitting: Family Medicine

## 2016-04-03 ENCOUNTER — Encounter: Payer: Self-pay | Admitting: Family Medicine

## 2016-04-03 ENCOUNTER — Ambulatory Visit (INDEPENDENT_AMBULATORY_CARE_PROVIDER_SITE_OTHER): Payer: Medicaid Other | Admitting: Family Medicine

## 2016-04-03 VITALS — BP 112/77 | HR 99 | Temp 98.7°F | Ht 65.5 in | Wt 177.4 lb

## 2016-04-03 DIAGNOSIS — M545 Low back pain, unspecified: Secondary | ICD-10-CM

## 2016-04-03 DIAGNOSIS — Z79899 Other long term (current) drug therapy: Secondary | ICD-10-CM | POA: Diagnosis not present

## 2016-04-03 LAB — URINALYSIS
BILIRUBIN UA: NEGATIVE
Glucose, UA: NEGATIVE
Ketones, UA: NEGATIVE
NITRITE UA: NEGATIVE
PH UA: 6 (ref 5.0–7.5)
PROTEIN UA: NEGATIVE
Urobilinogen, Ur: 0.2 mg/dL (ref 0.2–1.0)

## 2016-04-03 NOTE — Progress Notes (Signed)
   Subjective:    Patient ID: Jaime JacobsonAprel D Reaney, female    DOB: June 25, 1980, 36 y.o.   MRN: [id number]  HPI patient is here to have her Xanax refilled. She tells me that she has some flank pain and is voiding only 2-3 times a day. When she arrived here she had to urinate quickly and was rushed into the bathroom whereupon the specimen temperature did not come up to standards suggesting that this was not her urine. She also told the nurse that she no longer takes Suboxone yet this prescription was filled on March 8 as well as February 7. There is inconsistency here. She had previously tested positive for cocaine and had made up an excuse that it was because she was using some analgesic on her gum. There are many consistencies and I feel she has violated drug contract. Today I told her how would not refill the Xanax but did give her the option of giving us another specimen when she was told this she left.  Patient Active Problem List   Diagnosis Date Noted  . Dysuria 07/26/2015  . Generalized anxiety disorder 10/05/2014  . Opioid dependence (HCC) 12/04/2010   Outpatient Encounter Prescriptions as of 04/03/2016  Medication Sig  . ALPRAZolam (XANAX) 1 MG tablet Take 1 tablet (1 mg total) by mouth 2 (two) times daily as needed for anxiety.  . [DISCONTINUED] amoxicillin (AMOXIL) 500 MG capsule Take 1 capsule (500 mg total) by mouth 3 (three) times daily.   No facility-administered encounter medications on file as of 04/03/2016.       Review of Systems  Musculoskeletal: Positive for back pain.       Objective:   Physical Exam  Constitutional: She is oriented to person, place, and time. She appears well-developed and well-nourished.  Cardiovascular: Normal rate and regular rhythm.   Pulmonary/Chest: Effort normal and breath sounds normal.  Neurological: She is alert and oriented to person, place, and time.   BP 112/77   Pulse 99   Temp 98.7 F (37.1 C) (Oral)   Ht 5' 5.5" (1.664 m)   Wt 177  lb 6.4 oz (80.5 kg)   BMI 29.07 kg/m         Assessment & Plan:  1. Low back pain without sciatica, unspecified back pain laterality, unspecified chronicity Urinalysis has small amount of leukocytes and blood. This certainly does not look to be infectious but we can do a culture. - Urinalysis - Urine culture  2. Medication management Unlikely that the specimen she provided was her ears as it did not register a normal temperature. When asked to repeat the urine specimen she left. - Tox[employer] Select 13 (MW), Urine  Frederica KusterStephen M Miller MD

## 2016-04-03 NOTE — Telephone Encounter (Signed)
Patient has a follow up appointment scheduled. 

## 2016-04-04 LAB — URINE CULTURE: Organism ID, Bacteria: NO GROWTH

## 2016-04-23 ENCOUNTER — Telehealth: Payer: Self-pay | Admitting: Family Medicine

## 2016-04-23 NOTE — Telephone Encounter (Signed)
Aware, the urine was done checking for infection not a drug screen.  No pain medications or xanax scripts to be filled due to breaking the pain contract.

## 2017-12-26 ENCOUNTER — Ambulatory Visit: Payer: Self-pay | Admitting: Family Medicine

## 2018-04-01 IMAGING — CT CT ABD-PELV W/ CM
2 of 4 series · 16 of 46 positions shown, 18 images · IV contrast (iopamidol)
Comparison: CT scans dated 05/27/2013 and 11/12/2012

CLINICAL DATA: Dysuria.  Recurrent urinary tract infections.

EXAM:
CT ABDOMEN AND PELVIS WITH CONTRAST
TECHNIQUE: Multidetector CT imaging of the abdomen and pelvis was performed
using the standard protocol following bolus administration of
intravenous contrast.
CONTRAST:  100mL IPK7L6-Q00 IOPAMIDOL (IPK7L6-Q00) INJECTION 61%

[Series 2: routine abd pel with · axial · 0.67mm/px · z∈[-456,-30]mm · 13 of 95 slices shown, 15 images]
[im 5/95  soft-tissue]
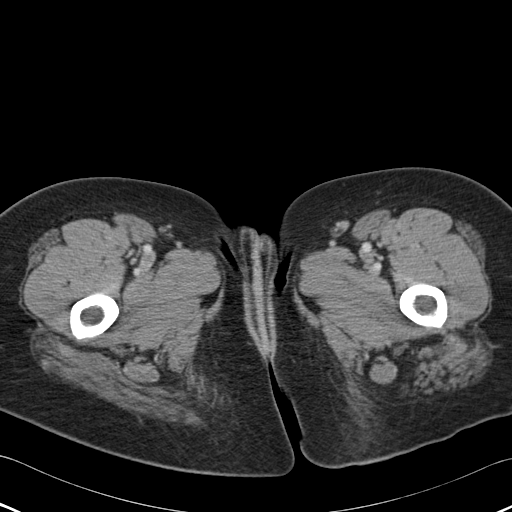
[im 5/95  bone]
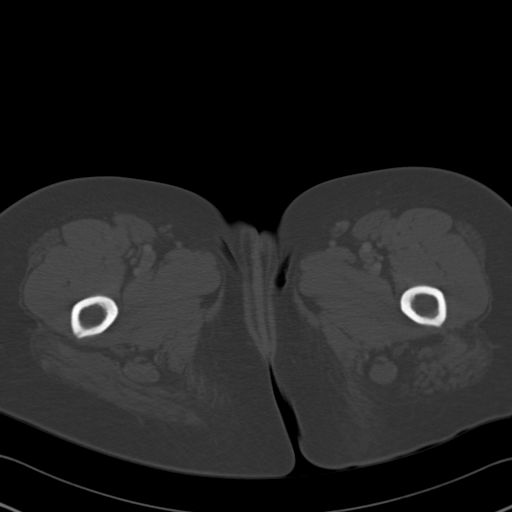
[im 13/95  soft-tissue]
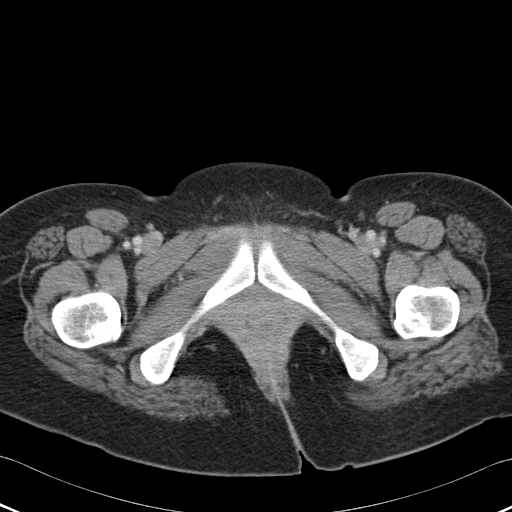
[im 21/95  soft-tissue]
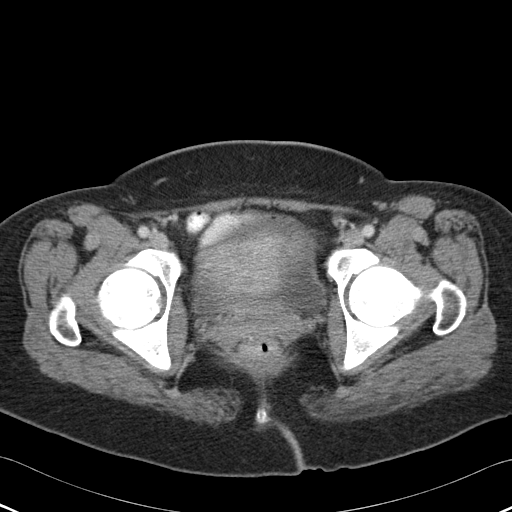
[im 25/95  soft-tissue]
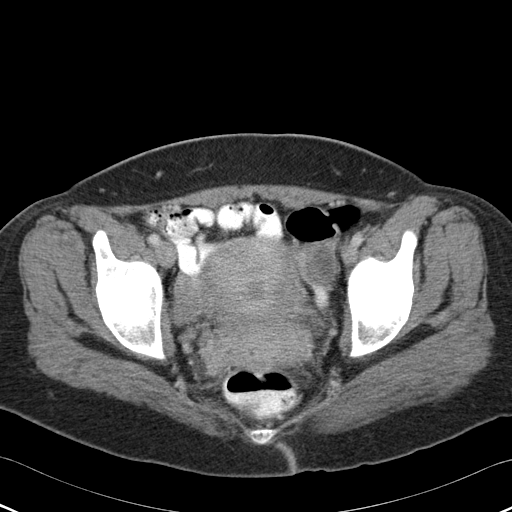
[im 33/95  soft-tissue]
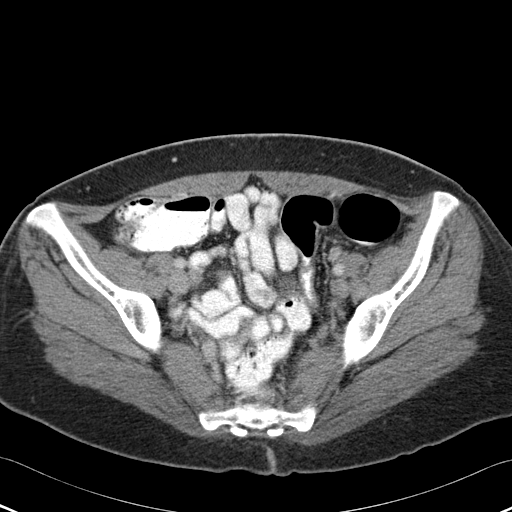
[im 41/95  soft-tissue]
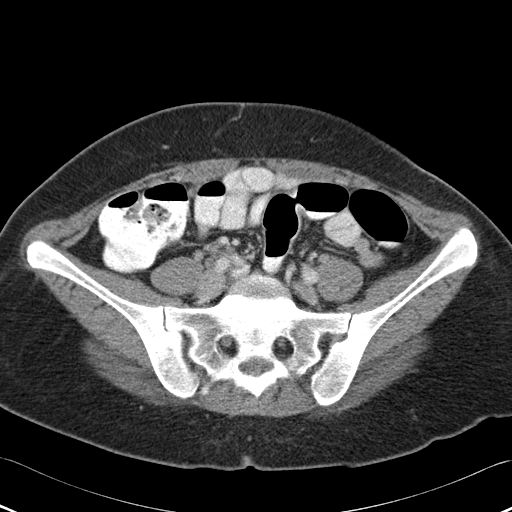
[im 50/95  soft-tissue]
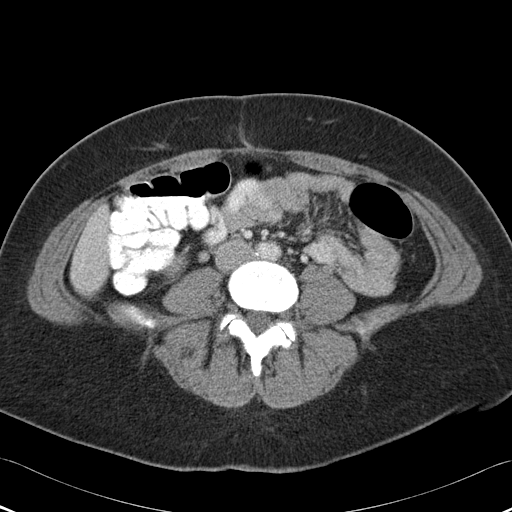
[im 54/95  soft-tissue]
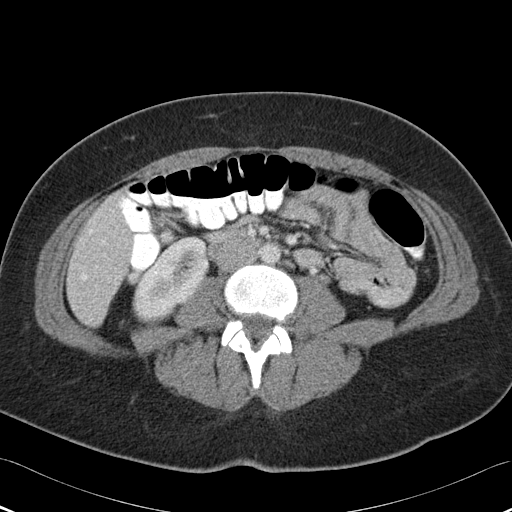
[im 62/95  soft-tissue]
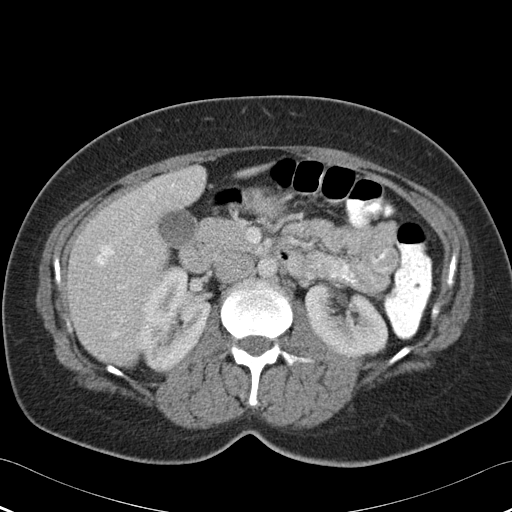
[im 62/95  bone]
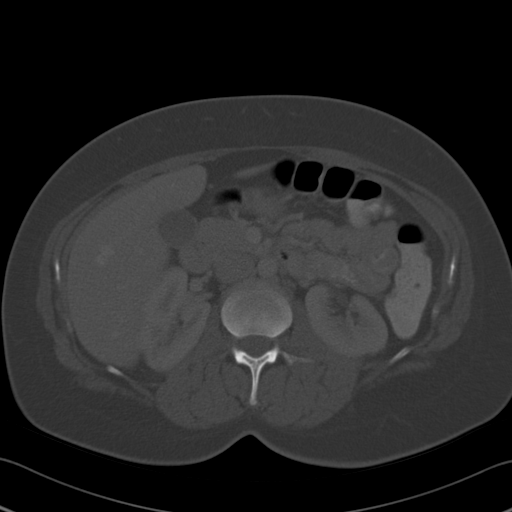
[im 70/95  soft-tissue]
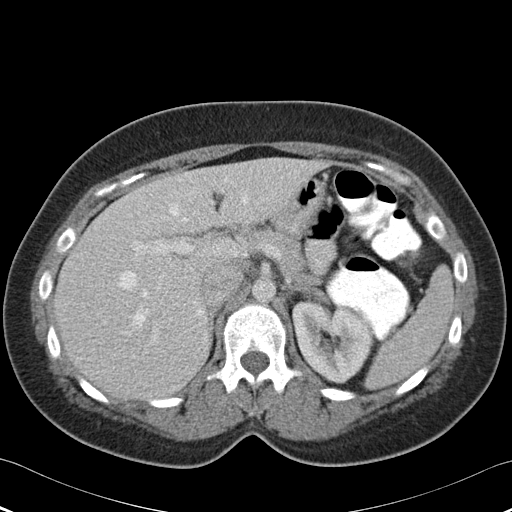
[im 74/95  soft-tissue]
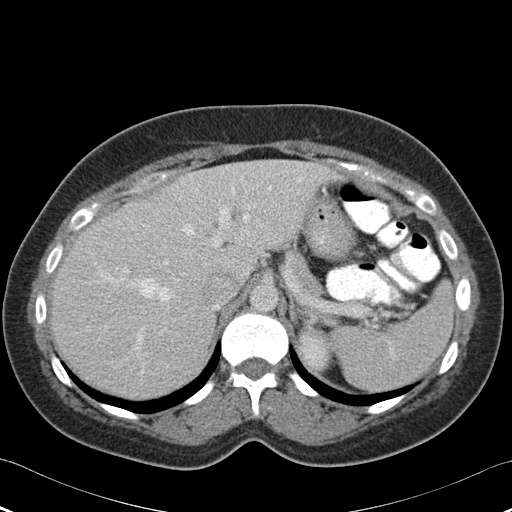
[im 82/95  soft-tissue]
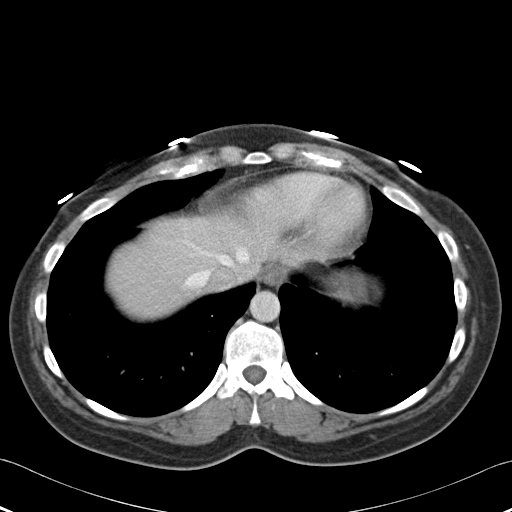
[im 90/95  soft-tissue]
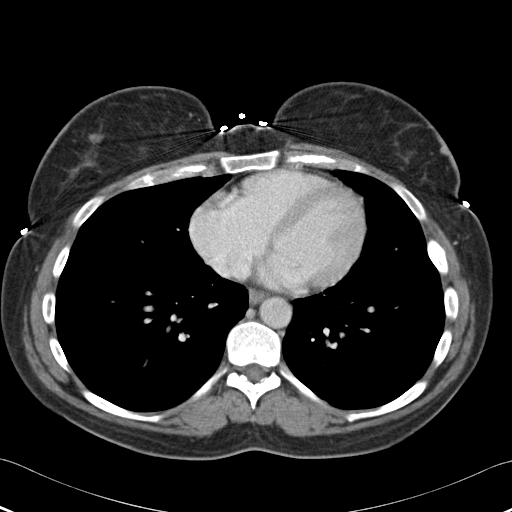

[Series 4: coronal · coronal · 0.70mm/px · 3 of 123 slices shown]
[im 41/123  soft-tissue]
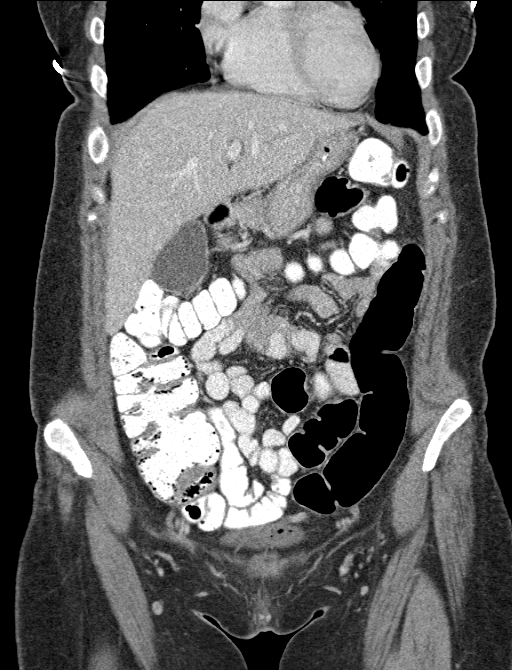
[im 55/123  soft-tissue]
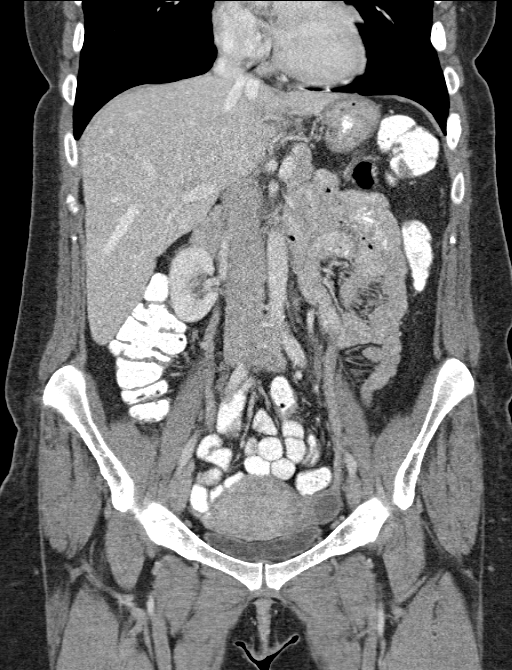
[im 68/123  soft-tissue]
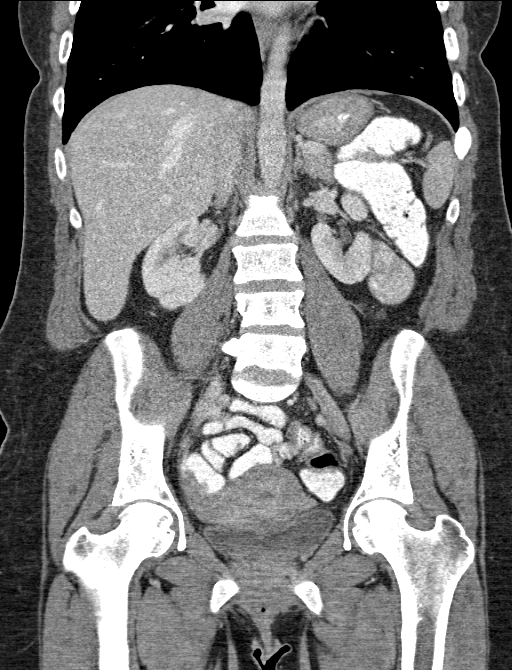

[16 of 46 positions shown; findings below may reference images not displayed]

FINDINGS: Lower chest:  Normal.

Hepatobiliary: No masses or other significant abnormality.

Pancreas: No mass, inflammatory changes, or other significant
abnormality.

Spleen: Within normal limits in size and appearance.

Adrenals/Urinary Tract: The kidneys and adrenal glands are normal.
No hydronephrosis. Bladder appears normal.

Stomach/Bowel: Normal.

Vascular/Lymphatic: Slight atherosclerosis of the distal abdominal
aorta. No adenopathy.

Reproductive: Uterus and right ovary are normal. 2.3 cm cyst on the
left ovary.

Other: No free air or free fluid.

Musculoskeletal: Unilateral pars defect on the right at L5. No
spondylolisthesis. Bones otherwise appear normal.
IMPRESSION: No significant abnormality of the abdomen and pelvis. Specifically,
the urinary tract appears normal.

Unilateral pars defect at L5 on the right.

## 2020-10-10 ENCOUNTER — Emergency Department (HOSPITAL_COMMUNITY)
Admission: EM | Admit: 2020-10-10 | Discharge: 2020-10-11 | Payer: Medicaid Other | Attending: Emergency Medicine | Admitting: Emergency Medicine

## 2020-10-10 ENCOUNTER — Emergency Department (HOSPITAL_COMMUNITY): Payer: Medicaid Other

## 2020-10-10 ENCOUNTER — Encounter (HOSPITAL_COMMUNITY): Payer: Self-pay

## 2020-10-10 ENCOUNTER — Other Ambulatory Visit: Payer: Self-pay

## 2020-10-10 DIAGNOSIS — F17210 Nicotine dependence, cigarettes, uncomplicated: Secondary | ICD-10-CM | POA: Diagnosis not present

## 2020-10-10 DIAGNOSIS — R079 Chest pain, unspecified: Secondary | ICD-10-CM

## 2020-10-10 DIAGNOSIS — R000 Tachycardia, unspecified: Secondary | ICD-10-CM | POA: Insufficient documentation

## 2020-10-10 DIAGNOSIS — R531 Weakness: Secondary | ICD-10-CM | POA: Insufficient documentation

## 2020-10-10 DIAGNOSIS — J948 Other specified pleural conditions: Secondary | ICD-10-CM | POA: Insufficient documentation

## 2020-10-10 DIAGNOSIS — R1013 Epigastric pain: Secondary | ICD-10-CM | POA: Insufficient documentation

## 2020-10-10 DIAGNOSIS — R0602 Shortness of breath: Secondary | ICD-10-CM | POA: Diagnosis not present

## 2020-10-10 MED ORDER — LIDOCAINE VISCOUS HCL 2 % MT SOLN
15.0000 mL | Freq: Once | OROMUCOSAL | Status: AC
  Administered 2020-10-10: 15 mL via ORAL
  Filled 2020-10-10: qty 15

## 2020-10-10 MED ORDER — ALUM & MAG HYDROXIDE-SIMETH 200-200-20 MG/5ML PO SUSP
30.0000 mL | Freq: Once | ORAL | Status: AC
  Administered 2020-10-10: 30 mL via ORAL
  Filled 2020-10-10: qty 30

## 2020-10-10 NOTE — ED Triage Notes (Signed)
Pt from [facility] jail with officer for c/o chest pain that started yesterday, pt says it hurts to swallow and hurts to eat.

## 2020-10-11 LAB — CBC WITH DIFFERENTIAL/PLATELET
Abs Immature Granulocytes: 0.26 10*3/uL — ABNORMAL HIGH (ref 0.00–0.07)
Basophils Absolute: 0.1 10*3/uL (ref 0.0–0.1)
Basophils Relative: 0 %
Eosinophils Absolute: 0.6 10*3/uL — ABNORMAL HIGH (ref 0.0–0.5)
Eosinophils Relative: 2 %
HCT: 43.3 % (ref 36.0–46.0)
Hemoglobin: 13.4 g/dL (ref 12.0–15.0)
Immature Granulocytes: 1 %
Lymphocytes Relative: 15 %
Lymphs Abs: 4.1 10*3/uL — ABNORMAL HIGH (ref 0.7–4.0)
MCH: 26.9 pg (ref 26.0–34.0)
MCHC: 30.9 g/dL (ref 30.0–36.0)
MCV: 86.8 fL (ref 80.0–100.0)
Monocytes Absolute: 1.2 10*3/uL — ABNORMAL HIGH (ref 0.1–1.0)
Monocytes Relative: 5 %
Neutro Abs: 20.4 10*3/uL — ABNORMAL HIGH (ref 1.7–7.7)
Neutrophils Relative %: 77 %
Platelets: 285 10*3/uL (ref 150–400)
RBC: 4.99 MIL/uL (ref 3.87–5.11)
RDW: 16.8 % — ABNORMAL HIGH (ref 11.5–15.5)
WBC: 26.7 10*3/uL — ABNORMAL HIGH (ref 4.0–10.5)
nRBC: 0 % (ref 0.0–0.2)

## 2020-10-11 LAB — COMPREHENSIVE METABOLIC PANEL
ALT: 33 U/L (ref 0–44)
AST: 36 U/L (ref 15–41)
Albumin: 3.7 g/dL (ref 3.5–5.0)
Alkaline Phosphatase: 67 U/L (ref 38–126)
Anion gap: 11 (ref 5–15)
BUN: 16 mg/dL (ref 6–20)
CO2: 22 mmol/L (ref 22–32)
Calcium: 9 mg/dL (ref 8.9–10.3)
Chloride: 108 mmol/L (ref 98–111)
Creatinine, Ser: 0.72 mg/dL (ref 0.44–1.00)
GFR, Estimated: >60 mL/min (ref >60)
Glucose, Bld: 105 mg/dL — ABNORMAL HIGH (ref 70–99)
Potassium: 3.1 mmol/L — ABNORMAL LOW (ref 3.5–5.1)
Sodium: 141 mmol/L (ref 135–145)
Total Bilirubin: 0.5 mg/dL (ref 0.3–1.2)
Total Protein: 7.5 g/dL (ref 6.5–8.1)

## 2020-10-11 LAB — TROPONIN I (HIGH SENSITIVITY)
Troponin I (High Sensitivity): 104 ng/L (ref <18)
Troponin I (High Sensitivity): 99 ng/L — ABNORMAL HIGH (ref <18)

## 2020-10-11 MED ORDER — DOXYCYCLINE HYCLATE 100 MG PO CAPS: 100.0000 mg | ORAL_CAPSULE | Freq: Two times a day (BID) | ORAL | 0 refills | Status: AC

## 2020-10-11 MED ORDER — LACTATED RINGERS IV BOLUS
1000.0000 mL | Freq: Once | INTRAVENOUS | Status: AC
  Administered 2020-10-11: 1000 mL via INTRAVENOUS

## 2020-10-11 MED ORDER — SODIUM CHLORIDE 0.9 % IV SOLN
100.0000 mg | Freq: Once | INTRAVENOUS | Status: AC
  Administered 2020-10-11: 100 mg via INTRAVENOUS
  Filled 2020-10-11 (×2): qty 100

## 2020-10-11 NOTE — Discharge Instructions (Signed)
The fluid and air in your left lung are improved from July but still persistent.  There may be a component of infection there so you are being started on antibiotics.  You need to see a cardiothoracic surgeon as soon as possible for likely surgery/procedure to help clear this up.  This is not an emergency and our cardiothoracic surgeon states that they would not build to do it on outpatient basis for at least a week.  Thus I recommend immediate consultation with the cardiothoracic surgeon for further management. If this can not happen within 48 hours, please return to this ED for further consideration and reevaluation. If anything changes or worsens please return here immediately.

## 2020-10-11 NOTE — ED Notes (Signed)
ED Provider at bedside. 

## 2020-10-11 NOTE — ED Provider Notes (Signed)
[facility] EMERGENCY DEPARTMENT Provider Note   CSN: [id number] Arrival date & time: 10/10/20  2225     History Chief Complaint  Patient presents with   Chest Pain    Jaime Vaughn is a 40 y.o. female.  40 year old patient presents here with chest pain.  Patient history obtained from her and from medical records and from the office or with other.  Patient was admitted to [facility] back in June for a left-sided pneumonia with what sounds like an upper empyema.  She subsequently developed a hydropneumothorax and had to be intubated because of a tension component.  Patient also underwent a VATS procedure during that hospitalization.  She ended up leaving AGAINST MEDICAL ADVICE on July 19 a week prior to finishing the appropriate antibiotics.  She then presented to [facility] in [place] where she had a chest x-ray done that showed persistent hydropneumothorax but improved.  She was recommended to stay in the hospital however she left AMA once again.  Since that time she has been arrested and incarcerated.  She is currently serving her sentence.  She states over the last 1 to 2 days she has had some epigastric pain.  She states is worse whenever she swallows.  States it also hurts sometimes when she coughs.  No fevers.  No nausea or vomiting.  She does have some mild generalized weakness.  Some shortness of breath with activity.   Chest Pain     Past Medical History:  Diagnosis Date   Anxiety    Narcotic abuse [facility])     Patient Active Problem List   Diagnosis Date Noted   Dysuria 07/26/2015   Generalized anxiety disorder 10/05/2014   Opioid dependence (HCC) 12/04/2010    Past Surgical History:  Procedure Laterality Date   DILATION AND CURETTAGE OF UTERUS     1.67mo ago   WISDOM TOOTH EXTRACTION     6-48mo ago     OB History   No obstetric history on file.     No family history on file.  Social History   Tobacco Use   Smoking status: Every Day    Packs/day: 1.00     Years: 10.00    Pack years: 10.00    Types: Cigarettes   Smokeless tobacco: Never  Substance Use Topics   Alcohol use: No   Drug use: No    Types: Marijuana, Opium    Comment: Percodet and Vicodin    Home Medications Prior to Admission medications   Medication Sig Start Date End Date Taking? Authorizing Provider  doxycycline (VIBRAMYCIN) 100 MG capsule Take 1 capsule (100 mg total) by mouth 2 (two) times daily. One po bid x 7 days 10/11/20  Yes Luverna Degenhart, Barbara Cower, MD  ALPRAZolam Prudy Feeler) 1 MG tablet Take 1 tablet (1 mg total) by mouth 2 (two) times daily as needed for anxiety. 03/06/16   Frederica Kuster, MD    Allergies    Sulfa antibiotics and Tramadol  Review of Systems   Review of Systems  Cardiovascular:  Positive for chest pain.  All other systems reviewed and are negative.  Physical Exam Updated Vital Signs BP 123/72 (BP Location: Left Arm)   Pulse 97   Temp 98 F (36.7 C) (Oral)   Resp 19   Ht 5\' 7"  (1.702 m)   Wt 84.8 kg   LMP 09/09/2020 (Approximate)   SpO2 98%   BMI 29.29 kg/m   Physical Exam Vitals and nursing note reviewed.  Constitutional:  Appearance: She is well-developed.  HENT:     Head: Normocephalic and atraumatic.  Cardiovascular:     Rate and Rhythm: Regular rhythm. Tachycardia present.  Pulmonary:     Effort: No respiratory distress.     Breath sounds: No stridor. Examination of the left-upper field reveals decreased breath sounds. Examination of the left-lower field reveals decreased breath sounds. Decreased breath sounds present.  Chest:     Chest wall: No mass or deformity.  Abdominal:     General: There is no distension.     Palpations: Abdomen is soft.  Musculoskeletal:        General: Normal range of motion.     Cervical back: Normal range of motion.     Right lower leg: No edema.     Left lower leg: No edema.  Skin:    General: Skin is warm and dry.  Neurological:     Mental Status: She is alert.    ED Results / Procedures  / Treatments   Labs (all labs ordered are listed, but only abnormal results are displayed) Labs Reviewed  CBC WITH DIFFERENTIAL/PLATELET - Abnormal; Notable for the following components:      Result Value   WBC 26.7 (*)    RDW 16.8 (*)    Neutro Abs 20.4 (*)    Lymphs Abs 4.1 (*)    Monocytes Absolute 1.2 (*)    Eosinophils Absolute 0.6 (*)    Abs Immature Granulocytes 0.26 (*)    All other components within normal limits  COMPREHENSIVE METABOLIC PANEL - Abnormal; Notable for the following components:   Potassium 3.1 (*)    Glucose, Bld 105 (*)    All other components within normal limits  TROPONIN I (HIGH SENSITIVITY) - Abnormal; Notable for the following components:   Troponin I (High Sensitivity) 104 (*)    All other components within normal limits  TROPONIN I (HIGH SENSITIVITY) - Abnormal; Notable for the following components:   Troponin I (High Sensitivity) 99 (*)    All other components within normal limits  PREGNANCY, URINE  RAPID URINE DRUG SCREEN, HOSP PERFORMED    EKG EKG Interpretation  Date/Time:  Tuesday October 10 2020 22:38:27 EDT Ventricular Rate:  98 PR Interval:  110 QRS Duration: 72 QT Interval:  392 QTC Calculation: 500 R Axis:   -5 Text Interpretation: Sinus rhythm with short PR Cannot rule out Anterior infarct , age undetermined ST & T wave abnormality, consider inferolateral ischemia , new since last tracing Prolonged QT Abnormal ECG Confirmed by Linwood Dibbles [id number]) on 10/10/2020 10:43:21 PM  Radiology DG Chest Portable 1 View  Addendum Date: 10/10/2020   ADDENDUM REPORT: 10/10/2020 23:52 ADDENDUM: These results were called by telephone at the time of interpretation on 10/10/2020 at 11:50 pm to provider [facility] , who verbally acknowledged these results. Electronically Signed   By: Tish Frederickson M.D.   On: 10/10/2020 23:52   Result Date: 10/10/2020 CLINICAL DATA:  Pain EXAM: PORTABLE CHEST 1 VIEW COMPARISON:  Chest x-ray 08/03/2020 FINDINGS:  The heart and mediastinal contours are within normal limits. No focal consolidation. No pulmonary edema. No pleural effusion. Left chest loculated hydropneumothorax. Associated mild volume loss on the left. No acute osseous abnormality. IMPRESSION: Left chest loculated hydropneumothorax.  No tension component. Electronically Signed: By: Tish Frederickson M.D. On: 10/10/2020 23:42    Procedures Procedures   Medications Ordered in ED Medications  doxycycline (VIBRAMYCIN) 100 mg in sodium chloride 0.9 % 250 mL IVPB ( Intravenous Rate/Dose  Change 10/11/20 0405)  alum & mag hydroxide-simeth (MAALOX/MYLANTA) 200-200-20 MG/5ML suspension 30 mL (30 mLs Oral Given 10/10/20 2346)    And  lidocaine (XYLOCAINE) 2 % viscous mouth solution 15 mL (15 mLs Oral Given 10/10/20 2346)  lactated ringers bolus 1,000 mL (0 mLs Intravenous Stopped 10/11/20 0358)    ED Course  I have reviewed the triage vital signs and the nursing notes.  Pertinent labs & imaging results that were available during my care of the patient were reviewed by me and considered in my medical decision making (see chart for details).    MDM Rules/Calculators/A&P                           Work-up as above.  I discussed with Dr. Cliffton Asters with cardiothoracic surgery.  He did not feel that she needed an emergent procedure.  He suggested that she follow-up with her previous cardiothoracic surgeon.  I discussed with officer at bedside who gave me the phone number for [facility].  I discussed with him the cardiothoracic surgeons recommendations and he said that they did not transport patients out of state for treatment in this situation.  He stated that he could not get her to see a cardiothoracic surgeon tomorrow in [place] at the [facility] prison.  Per my discussion with Dr. Cliffton Asters I felt like the patient was stable for discharge to their custody with understanding that she would see somebody within 24 and at the latest 48 hours.  We  will prophylactically treat her with antibiotics because of an elevated white blood cell count.  He I discussed any changes to transfer her there to bring her back here so we could reevaluate the options.  Troponin is slightly elevated by think this is more related to the lung findings as it is stable.   Final Clinical Impression(s) / ED Diagnoses Final diagnoses:  Nonspecific chest pain  Hydropneumothorax    Rx / DC Orders ED Discharge Orders          Ordered    doxycycline (VIBRAMYCIN) 100 MG capsule  2 times daily        10/11/20 0429             Reise Hietala, Barbara Cower, MD 10/11/20 [phone]

## 2020-10-11 NOTE — ED Notes (Signed)
Pt unable to give a urine sample a this time

## 2020-10-11 NOTE — ED Notes (Signed)
Pt attempted to take Maalox/lidocaine. Was not able to keep all of it down

## 2020-10-11 NOTE — ED Notes (Signed)
Pt refused to give urine sample

## 2020-10-11 NOTE — ED Notes (Signed)
Doxycycline not accessible. Called AC, will bring to unit

## 2020-10-11 NOTE — ED Notes (Signed)
Gave pt grape juice and ice chips

## 2023-08-06 ENCOUNTER — Encounter: Payer: Self-pay | Admitting: *Deleted

## 2023-08-14 DIAGNOSIS — Z79899 Other long term (current) drug therapy: Secondary | ICD-10-CM | POA: Diagnosis not present

## 2024-01-29 ENCOUNTER — Ambulatory Visit (INDEPENDENT_AMBULATORY_CARE_PROVIDER_SITE_OTHER)

## 2024-01-29 VITALS — BP 129/86 | HR 72 | Temp 97.7°F | Ht 67.0 in | Wt 218.0 lb

## 2024-01-29 DIAGNOSIS — J869 Pyothorax without fistula: Secondary | ICD-10-CM | POA: Diagnosis not present

## 2024-01-29 DIAGNOSIS — Z720 Tobacco use: Secondary | ICD-10-CM

## 2024-01-29 DIAGNOSIS — F17210 Nicotine dependence, cigarettes, uncomplicated: Secondary | ICD-10-CM

## 2024-01-29 DIAGNOSIS — J42 Unspecified chronic bronchitis: Secondary | ICD-10-CM

## 2024-01-29 DIAGNOSIS — G8918 Other acute postprocedural pain: Secondary | ICD-10-CM | POA: Diagnosis not present

## 2024-01-29 LAB — CBC WITH DIFFERENTIAL/PLATELET
Basophils Absolute: 0.1 K/uL (ref 0.0–0.1)
Basophils Relative: 0.7 % (ref 0.0–3.0)
Eosinophils Absolute: 0.2 K/uL (ref 0.0–0.7)
Eosinophils Relative: 2.8 % (ref 0.0–5.0)
HCT: 39.6 % (ref 36.0–46.0)
Hemoglobin: 13.2 g/dL (ref 12.0–15.0)
Lymphocytes Relative: 30.2 % (ref 12.0–46.0)
Lymphs Abs: 2.5 K/uL (ref 0.7–4.0)
MCHC: 33.3 g/dL (ref 30.0–36.0)
MCV: 89.8 fl (ref 78.0–100.0)
Monocytes Absolute: 0.4 K/uL (ref 0.1–1.0)
Monocytes Relative: 4.8 % (ref 3.0–12.0)
Neutro Abs: 5.1 K/uL (ref 1.4–7.7)
Neutrophils Relative %: 61.5 % (ref 43.0–77.0)
Platelets: 300 K/uL (ref 150.0–400.0)
RBC: 4.4 Mil/uL (ref 3.87–5.11)
RDW: 13.8 % (ref 11.5–15.5)
WBC: 8.3 K/uL (ref 4.0–10.5)

## 2024-01-29 LAB — BASIC METABOLIC PANEL WITH GFR
BUN: 12 mg/dL (ref 6–23)
CO2: 28 meq/L (ref 19–32)
Calcium: 8.9 mg/dL (ref 8.4–10.5)
Chloride: 105 meq/L (ref 96–112)
Creatinine, Ser: 0.8 mg/dL (ref 0.40–1.20)
GFR: 90.78 mL/min
Glucose, Bld: 88 mg/dL (ref 70–99)
Potassium: 4.5 meq/L (ref 3.5–5.1)
Sodium: 140 meq/L (ref 135–145)

## 2024-01-29 MED ORDER — ALBUTEROL SULFATE HFA 108 (90 BASE) MCG/ACT IN AERS: 2.0000 | INHALATION_SPRAY | Freq: Four times a day (QID) | RESPIRATORY_TRACT | 6 refills | Status: AC | PRN

## 2024-01-29 MED ORDER — BREZTRI AEROSPHERE 160-9-4.8 MCG/ACT IN AERO: 2.0000 | INHALATION_SPRAY | Freq: Two times a day (BID) | RESPIRATORY_TRACT | Status: AC

## 2024-01-29 NOTE — Progress Notes (Signed)
 "  New Patient Pulmonology Office Visit   Subjective:  Patient ID: Jaime Vaughn, female    DOB: 1980/02/24  MRN: [id number]  Referred by: Jaime Harder, NP  CC:  Chief Complaint  Patient presents with   Consult    Pneumonia MRSA. Patient sates she experiencing  pain on her left side, pain started after discharge from the ICU (2022). Hard to take deep breathes.    HPI Jaime Vaughn is a 44 y.o. 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 She continues to have left sided chest pain and is here to review results.  Discussed the use of AI scribe software for clinical note transcription with the patient, who gave verbal consent to proceed.  History of Present Illness Jaime Vaughn is a 44 year old female with a history of severe pneumonia and MRSA infection who presents with persistent left-sided chest pain and congestion. She was referred by Jaime Vaughn for evaluation of her persistent symptoms following a severe pneumonia and MRSA infection.  In 2022, she was hospitalized for severe pneumonia, which led to a left lung collapse due to a MRSA infection. She was transported by helicopter to [facility], [place] , and spent two weeks in the ICU on a ventilator. She underwent multiple surgeries, including the placement of two chest tubes to address the infection and lung collapse. She was hospitalized for approximately two months. As per chart review she had left AMA towards the end of that admission.  Since her discharge, she has experienced persistent sharp pain on her left side, which is exacerbated by touch and deep breathing. She describes the pain as feeling like 'a knife is stabbing' her. The pain has been ongoing since her hospitalization.  She reports significant congestion, particularly in her throat, which she feels constantly but is unable to expectorate. The congestion is worse in the morning. She attributes some of this to  her smoking habit, which is approximately a pack every three to four days.  She has not had any recent imaging studies such as x-rays or CT scans since her hospitalization. She recalls undergoing a CT scan during her hospital stay. She has not used inhalers outside of the hospital setting.  She lives in [place] and is currently seeking employment. Her closest family member is her daughter. No recent pneumonia or significant allergies.       ROS Review of symptoms negative except mentioned above  Allergies: Sulfa antibiotics and Tramadol Current Medications[1] Past Medical History:  Diagnosis Date   Anxiety    Narcotic abuse (HCC)    Past Surgical History:  Procedure Laterality Date   DILATION AND CURETTAGE OF UTERUS     1.77mo ago   WISDOM TOOTH EXTRACTION     6-42mo ago   History reviewed. No pertinent family history. Social History   Socioeconomic History   Marital status: Single    Spouse name: Not on file   Number of children: Not on file   Years of education: Not on file   Highest education level: Not on file  Occupational History   Not on file  Tobacco Use   Smoking status: Every Day    Current packs/day: 1.00    Average packs/day: 1 pack/day for 10.0 years (10.0 ttl pk-yrs)    Types: Cigarettes   Smokeless tobacco: Never   Tobacco comments:    Started smoking age 30. Smokes 2 cigarettes a day. 01/29/2024  Substance and  Sexual Activity   Alcohol use: No   Drug use: No    Types: Marijuana, Opium    Comment: Percodet and Vicodin   Sexual activity: Yes    Birth control/protection: Pill  Other Topics Concern   Not on file  Social History Narrative   Not on file   Social Drivers of Health   Tobacco Use: High Risk (01/29/2024)   Patient History    Smoking Tobacco Use: Every Day    Smokeless Tobacco Use: Never    Passive Exposure: Not on file  Financial Resource Strain: Not on file  Food Insecurity: Not on file  Transportation Needs: Not on file   Physical Activity: Not on file  Stress: Not on file  Social Connections: Not on file  Intimate Partner Violence: Not on file  Depression (EYV7-0): Not on file  Alcohol Screen: Not on file  Housing: Not on file  Utilities: Not on file  Health Literacy: Not on file         Objective:  BP 129/86   Pulse 72   Temp 97.7 F (36.5 C) (Oral)   Ht 5' 7 (1.702 m)   Wt 218 lb (98.9 kg)   SpO2 98%   BMI 34.14 kg/m    Physical Exam Constitutional:      General: She is not in acute distress.    Appearance: Normal appearance.  HENT:     Mouth/Throat:     Mouth: Mucous membranes are moist.  Cardiovascular:     Rate and Rhythm: Normal rate.  Pulmonary:     Effort: No respiratory distress.     Breath sounds: No wheezing or rales.  Musculoskeletal:     Right lower leg: No edema.     Left lower leg: No edema.     Comments: Tender on touch- thoracotomy scar area on left posterior chest  Skin:    General: Skin is warm.  Neurological:     Mental Status: She is alert and oriented to person, place, and time.  Psychiatric:        Mood and Affect: Mood normal.     Diagnostic Review:   Reviewed prior notes from 2022  Reviewed prior imaging 2022- left hydropneumothorax  Pft     No data to display               Results       Assessment & Plan:   Assessment & Plan Chronic bronchitis, unspecified chronic bronchitis type (HCC) Likely from ongoing smoking Empiric breztri  samples provided As needed albuterol  provided Orders:   Pulmonary function test; Future   Basic Metabolic Panel (BMET); Future   CBC with Differential   albuterol  (VENTOLIN  HFA) 108 (90 Base) MCG/ACT inhaler; Inhale 2 puffs into the lungs every 6 (six) hours as needed for wheezing or shortness of breath.  Empyema lung (HCC) S/p VATS in 2022 Will repeat CT chest now as pt has ongoing pain and congestion since then Orders:   CT CHEST W CONTRAST; Future   Pulmonary function test;  Future  Tobacco abuse Encouraged to quit smoking    Chest wall pain following surgery Quite tender on touch Likely related to nerve injury/irritation Will r/o parenchymal and intrathoracic causes with CT chest If negative, may benefit from local pain treatment options       Thank you for the opportunity to take part in the care of Mckay D [place]   Return in about 3 weeks (around 02/19/2024).   Celestial Barnfield Pleas, MD [facility] Office: [phone]     [1]  Current Outpatient Medications:    albuterol  (VENTOLIN  HFA) 108 (90 Base) MCG/ACT inhaler, Inhale 2 puffs into the lungs every 6 (six) hours as needed for wheezing or shortness of breath., Disp: 8 g, Rfl: 6   ALPRAZolam  (XANAX ) 1 MG tablet, Take 1 tablet (1 mg total) by mouth 2 (two) times daily as needed for anxiety., Disp: 60 tablet, Rfl: 0   budesonide-glycopyrrolate-formoterol (BREZTRI  AEROSPHERE) 160-9-4.8 MCG/ACT AERO inhaler, Inhale 2 puffs into the lungs in the morning and at bedtime., Disp: , Rfl:    gabapentin (NEURONTIN) 300 MG capsule, Take 300 mg by mouth., Disp: , Rfl:    meloxicam  (MOBIC ) 7.5 MG tablet, Take 7.5 mg by mouth., Disp: , Rfl:    doxycycline  (VIBRAMYCIN ) 100 MG capsule, Take 1 capsule (100 mg total) by mouth 2 (two) times daily. One po bid x 7 days (Patient not taking: Reported on 01/29/2024), Disp: 14 capsule, Rfl: 0  "

## 2024-01-29 NOTE — Patient Instructions (Signed)
" °  VISIT SUMMARY: During your visit, we discussed your persistent left-sided chest pain and congestion following a severe pneumonia and MRSA infection in 2022. We reviewed your symptoms and planned further evaluations to address your concerns.  YOUR PLAN: CHRONIC BRONCHITIS: You have chronic congestion, likely worsened by smoking, with symptoms including morning congestion and difficulty clearing mucus. -We ordered a pulmonary function test to check for COPD. -We provided a sample inhaler for you to try. -We prescribed a rescue inhaler for use as needed. -Please rinse your mouth after using the inhalers.  CHRONIC POST-THORACOTOMY CHEST PAIN: You have persistent sharp pain on your left side since your thoracic surgery in 2022, likely related to the surgery and possible nerve irritation. -We ordered a CT scan of your chest to check for any residual scarring or complications. -We ordered lab tests to ensure your kidney function is adequate for the CT scan with contrast.  HISTORY OF EMPYEMA, STATUS POST THORACIC SURGERY: You had an empyema due to a MRSA infection in 2022, treated with thoracic surgery and chest tube placement. We need to assess the current status of your lung. -We ordered a CT scan of your chest to evaluate the current status of your lung. -We ordered lab tests to ensure your kidney function is adequate for the CT scan with contrast.      Contains text generated by [employer].   "

## 2024-02-06 ENCOUNTER — Ambulatory Visit (HOSPITAL_COMMUNITY): Admission: RE | Admit: 2024-02-06 | Source: Ambulatory Visit

## 2024-02-06 ENCOUNTER — Telehealth: Payer: Self-pay

## 2024-02-06 NOTE — Telephone Encounter (Signed)
 Copied from CRM #[id number]. Topic: Appointments - Scheduling Inquiry for Clinic >> Feb 06, 2024 11:39 AM Dedra B wrote: Reason for CRM: Patient wants to reschedue CT scan scheduled for today at 5.  Called to discuss rescheduling CT scan---no answer and no voice mail---will cancel testing and keep trying to reach patient

## 2024-02-19 ENCOUNTER — Ambulatory Visit (HOSPITAL_COMMUNITY)

## 2024-02-24 ENCOUNTER — Encounter

## 2024-03-10 ENCOUNTER — Ambulatory Visit
